# Patient Record
Sex: Male | Born: 1950 | Race: White | Hispanic: No | Marital: Married | Smoking: Never smoker
Health system: Southern US, Community
[De-identification: ages and names within clinical notes are randomized; demographics above are authoritative.]

## PROBLEM LIST (undated history)

## (undated) DIAGNOSIS — G4733 Obstructive sleep apnea (adult) (pediatric): Secondary | ICD-10-CM

## (undated) DIAGNOSIS — E7800 Pure hypercholesterolemia, unspecified: Secondary | ICD-10-CM

## (undated) DIAGNOSIS — I10 Essential (primary) hypertension: Secondary | ICD-10-CM

## (undated) DIAGNOSIS — C61 Malignant neoplasm of prostate: Secondary | ICD-10-CM

## (undated) DIAGNOSIS — E119 Type 2 diabetes mellitus without complications: Secondary | ICD-10-CM

## (undated) DIAGNOSIS — C801 Malignant (primary) neoplasm, unspecified: Secondary | ICD-10-CM

## (undated) DIAGNOSIS — M109 Gout, unspecified: Secondary | ICD-10-CM

## (undated) DIAGNOSIS — K219 Gastro-esophageal reflux disease without esophagitis: Secondary | ICD-10-CM

## (undated) DIAGNOSIS — G629 Polyneuropathy, unspecified: Secondary | ICD-10-CM

## (undated) DIAGNOSIS — E785 Hyperlipidemia, unspecified: Secondary | ICD-10-CM

## (undated) HISTORY — DX: Malignant neoplasm of prostate: C61

## (undated) HISTORY — DX: Obstructive sleep apnea (adult) (pediatric): G47.33

## (undated) HISTORY — DX: Gout, unspecified: M10.9

## (undated) HISTORY — PX: PROSTATE SURGERY: SHX751

## (undated) HISTORY — DX: Gastro-esophageal reflux disease without esophagitis: K21.9

## (undated) HISTORY — DX: Polyneuropathy, unspecified: G62.9

---

## 2000-03-06 ENCOUNTER — Ambulatory Visit (HOSPITAL_BASED_OUTPATIENT_CLINIC_OR_DEPARTMENT_OTHER): Admission: RE | Admit: 2000-03-06 | Discharge: 2000-03-06 | Payer: Self-pay | Admitting: Family Medicine

## 2000-03-09 HISTORY — PX: PROSTATE SURGERY: SHX751

## 2000-04-26 ENCOUNTER — Ambulatory Visit (HOSPITAL_BASED_OUTPATIENT_CLINIC_OR_DEPARTMENT_OTHER): Admission: RE | Admit: 2000-04-26 | Discharge: 2000-04-26 | Payer: Self-pay | Admitting: *Deleted

## 2000-06-23 ENCOUNTER — Encounter: Payer: Self-pay | Admitting: Family Medicine

## 2000-06-23 ENCOUNTER — Observation Stay (HOSPITAL_COMMUNITY): Admission: EM | Admit: 2000-06-23 | Discharge: 2000-06-24 | Payer: Self-pay | Admitting: Emergency Medicine

## 2000-11-26 ENCOUNTER — Other Ambulatory Visit: Admission: RE | Admit: 2000-11-26 | Discharge: 2000-11-26 | Payer: Self-pay | Admitting: Urology

## 2001-01-17 ENCOUNTER — Inpatient Hospital Stay (HOSPITAL_COMMUNITY): Admission: RE | Admit: 2001-01-17 | Discharge: 2001-01-20 | Payer: Self-pay | Admitting: Urology

## 2003-07-06 ENCOUNTER — Encounter: Admission: RE | Admit: 2003-07-06 | Discharge: 2003-07-06 | Payer: Self-pay | Admitting: Family Medicine

## 2006-01-06 ENCOUNTER — Encounter: Admission: RE | Admit: 2006-01-06 | Discharge: 2006-01-06 | Payer: Self-pay | Admitting: Family Medicine

## 2009-02-06 HISTORY — PX: CATARACT EXTRACTION: SUR2

## 2010-05-29 ENCOUNTER — Emergency Department (HOSPITAL_COMMUNITY)
Admission: EM | Admit: 2010-05-29 | Discharge: 2010-05-30 | Disposition: A | Discharge status: Home / Self Care | Payer: Self-pay | Attending: Emergency Medicine | Admitting: Emergency Medicine

## 2010-05-29 DIAGNOSIS — J020 Streptococcal pharyngitis: Secondary | ICD-10-CM | POA: Insufficient documentation

## 2010-05-29 DIAGNOSIS — E119 Type 2 diabetes mellitus without complications: Secondary | ICD-10-CM | POA: Insufficient documentation

## 2010-05-29 DIAGNOSIS — R070 Pain in throat: Secondary | ICD-10-CM | POA: Insufficient documentation

## 2010-05-29 DIAGNOSIS — Z79899 Other long term (current) drug therapy: Secondary | ICD-10-CM | POA: Insufficient documentation

## 2010-05-29 DIAGNOSIS — I10 Essential (primary) hypertension: Secondary | ICD-10-CM | POA: Insufficient documentation

## 2010-05-29 DIAGNOSIS — R05 Cough: Secondary | ICD-10-CM | POA: Insufficient documentation

## 2010-05-29 DIAGNOSIS — Z7982 Long term (current) use of aspirin: Secondary | ICD-10-CM | POA: Insufficient documentation

## 2010-05-29 DIAGNOSIS — R059 Cough, unspecified: Secondary | ICD-10-CM | POA: Insufficient documentation

## 2010-05-29 DIAGNOSIS — J3489 Other specified disorders of nose and nasal sinuses: Secondary | ICD-10-CM | POA: Insufficient documentation

## 2010-05-29 DIAGNOSIS — R0982 Postnasal drip: Secondary | ICD-10-CM | POA: Insufficient documentation

## 2010-05-30 LAB — RAPID STREP SCREEN (MED CTR MEBANE ONLY)

## 2013-03-13 ENCOUNTER — Emergency Department (HOSPITAL_COMMUNITY)
Admission: EM | Admit: 2013-03-13 | Discharge: 2013-03-13 | Disposition: A | Discharge status: Home / Self Care | Payer: 59 | Attending: Emergency Medicine | Admitting: Emergency Medicine

## 2013-03-13 ENCOUNTER — Encounter (HOSPITAL_COMMUNITY): Payer: Self-pay | Admitting: Emergency Medicine

## 2013-03-13 ENCOUNTER — Ambulatory Visit (HOSPITAL_COMMUNITY): Payer: 59

## 2013-03-13 DIAGNOSIS — Z8546 Personal history of malignant neoplasm of prostate: Secondary | ICD-10-CM | POA: Insufficient documentation

## 2013-03-13 DIAGNOSIS — Z9889 Other specified postprocedural states: Secondary | ICD-10-CM | POA: Insufficient documentation

## 2013-03-13 DIAGNOSIS — E7800 Pure hypercholesterolemia, unspecified: Secondary | ICD-10-CM | POA: Insufficient documentation

## 2013-03-13 DIAGNOSIS — E119 Type 2 diabetes mellitus without complications: Secondary | ICD-10-CM | POA: Insufficient documentation

## 2013-03-13 DIAGNOSIS — R109 Unspecified abdominal pain: Secondary | ICD-10-CM

## 2013-03-13 DIAGNOSIS — Z7982 Long term (current) use of aspirin: Secondary | ICD-10-CM | POA: Insufficient documentation

## 2013-03-13 DIAGNOSIS — I10 Essential (primary) hypertension: Secondary | ICD-10-CM | POA: Insufficient documentation

## 2013-03-13 DIAGNOSIS — R229 Localized swelling, mass and lump, unspecified: Secondary | ICD-10-CM

## 2013-03-13 DIAGNOSIS — R1031 Right lower quadrant pain: Secondary | ICD-10-CM | POA: Insufficient documentation

## 2013-03-13 DIAGNOSIS — R1012 Left upper quadrant pain: Secondary | ICD-10-CM | POA: Insufficient documentation

## 2013-03-13 HISTORY — DX: Pure hypercholesterolemia, unspecified: E78.00

## 2013-03-13 HISTORY — DX: Type 2 diabetes mellitus without complications: E11.9

## 2013-03-13 HISTORY — DX: Essential (primary) hypertension: I10

## 2013-03-13 HISTORY — DX: Malignant (primary) neoplasm, unspecified: C80.1

## 2013-03-13 LAB — COMPREHENSIVE METABOLIC PANEL
ALBUMIN: 4.6 g/dL (ref 3.5–5.2)
ALK PHOS: 76 U/L (ref 39–117)
ALT: 29 U/L (ref 0–53)
AST: 20 U/L (ref 0–37)
BUN: 16 mg/dL (ref 6–23)
CO2: 25 mEq/L (ref 19–32)
Calcium: 9.9 mg/dL (ref 8.4–10.5)
Chloride: 93 mEq/L — ABNORMAL LOW (ref 96–112)
Creatinine, Ser: 0.92 mg/dL (ref 0.50–1.35)
GFR calc Af Amer: >90 mL/min (ref >90)
GFR calc non Af Amer: 90 mL/min — ABNORMAL LOW (ref >90)
Glucose, Bld: 121 mg/dL — ABNORMAL HIGH (ref 70–99)
POTASSIUM: 4.5 meq/L (ref 3.7–5.3)
Sodium: 133 mEq/L — ABNORMAL LOW (ref 137–147)
TOTAL PROTEIN: 8.3 g/dL (ref 6.0–8.3)
Total Bilirubin: 0.6 mg/dL (ref 0.3–1.2)

## 2013-03-13 LAB — CBC WITH DIFFERENTIAL/PLATELET
BASOS ABS: 0 10*3/uL (ref 0.0–0.1)
BASOS PCT: 0 % (ref 0–1)
Eosinophils Absolute: 0.1 10*3/uL (ref 0.0–0.7)
Eosinophils Relative: 1 % (ref 0–5)
HCT: 45.6 % (ref 39.0–52.0)
Hemoglobin: 15.6 g/dL (ref 13.0–17.0)
Lymphocytes Relative: 18 % (ref 12–46)
Lymphs Abs: 1.9 10*3/uL (ref 0.7–4.0)
MCH: 27.6 pg (ref 26.0–34.0)
MCHC: 34.2 g/dL (ref 30.0–36.0)
MCV: 80.6 fL (ref 78.0–100.0)
Monocytes Absolute: 0.8 10*3/uL (ref 0.1–1.0)
Monocytes Relative: 7 % (ref 3–12)
NEUTROS ABS: 7.7 10*3/uL (ref 1.7–7.7)
NEUTROS PCT: 74 % (ref 43–77)
PLATELETS: 173 10*3/uL (ref 150–400)
RBC: 5.66 MIL/uL (ref 4.22–5.81)
RDW: 14.3 % (ref 11.5–15.5)
WBC: 10.5 10*3/uL (ref 4.0–10.5)

## 2013-03-13 LAB — URINALYSIS, ROUTINE W REFLEX MICROSCOPIC
Glucose, UA: NEGATIVE mg/dL
Hgb urine dipstick: NEGATIVE
Ketones, ur: NEGATIVE mg/dL
LEUKOCYTES UA: NEGATIVE
NITRITE: NEGATIVE
Protein, ur: 30 mg/dL — AB
SPECIFIC GRAVITY, URINE: 1.028 (ref 1.005–1.030)
Urobilinogen, UA: 1 mg/dL (ref 0.0–1.0)
pH: 5.5 (ref 5.0–8.0)

## 2013-03-13 LAB — URINE MICROSCOPIC-ADD ON

## 2013-03-13 LAB — LIPASE, BLOOD: Lipase: 17 U/L (ref 11–59)

## 2013-03-13 MED ORDER — PANTOPRAZOLE SODIUM 40 MG PO TBEC: 40.0000 mg | DELAYED_RELEASE_TABLET | Freq: Every day | ORAL | Status: DC

## 2013-03-13 MED ORDER — GI COCKTAIL ~~LOC~~
30.0000 mL | Freq: Once | ORAL | Status: AC
  Administered 2013-03-13: 30 mL via ORAL
  Filled 2013-03-13: qty 30

## 2013-03-13 MED ORDER — IOHEXOL 300 MG/ML  SOLN
100.0000 mL | Freq: Once | INTRAMUSCULAR | Status: AC | PRN
  Administered 2013-03-13: 100 mL via INTRAVENOUS

## 2013-03-13 MED ORDER — IOHEXOL 300 MG/ML  SOLN
50.0000 mL | Freq: Once | INTRAMUSCULAR | Status: AC | PRN
  Administered 2013-03-13: 50 mL via ORAL

## 2013-03-13 MED ORDER — SODIUM CHLORIDE 0.9 % IV BOLUS (SEPSIS)
1000.0000 mL | Freq: Once | INTRAVENOUS | Status: AC
  Administered 2013-03-13: 1000 mL via INTRAVENOUS

## 2013-03-13 NOTE — ED Provider Notes (Signed)
CSN: [id number]     Arrival date & time 03/13/13  1217 History   First MD Initiated Contact with Patient 03/13/13 1308     Chief Complaint  Patient presents with  . Abdominal Pain   (Consider location/radiation/quality/duration/timing/severity/associated sxs/prior Treatment) HPI Comments: 63 year old male presents with 3 days of upper abdominal pain he states he feels more like "discomfort" that specific pain. This is been intermittent but today it has been constant. He describes is about 5 or 6/10. No nausea or vomiting. He states that he ate a light meal this morning but felt like he just sat in his stomach. He has not had any diarrhea or vomiting. He states that he had a small bowel movement yesterday but was not hard. He's never had abdominal pain like this before he has had surgery for prostate cancer in the past but no other surgeries. No chest pain or shortness of breath. Declines wanting any pain medicines at this time.   Past Medical History  Diagnosis Date  . Cancer   . Hypertension   . Diabetes mellitus without complication   . High blood cholesterol    Past Surgical History  Procedure Laterality Date  . Prostate surgery     No family history on file. History  Substance Use Topics  . Smoking status: Never Smoker   . Smokeless tobacco: Not on file  . Alcohol Use: No    Review of Systems  Constitutional: Negative for fever and chills.  Respiratory: Negative for shortness of breath.   Cardiovascular: Negative for chest pain.  Gastrointestinal: Positive for abdominal pain. Negative for nausea, vomiting, diarrhea, constipation and blood in stool.  Genitourinary: Negative for dysuria and decreased urine volume.  Neurological: Negative for weakness.  All other systems reviewed and are negative.    Allergies  Review of patient's allergies indicates no known allergies.  Home Medications   Current Outpatient Rx  Name  Route  Sig  Dispense  Refill  . allopurinol  (ZYLOPRIM) 300 MG tablet   Oral   Take 300 mg by mouth at bedtime.         Marland Kitchen aspirin 325 MG tablet   Oral   Take 325 mg by mouth at bedtime.         . benazepril-hydrochlorthiazide (LOTENSIN HCT) 20-12.5 MG per tablet   Oral   Take 1 tablet by mouth at bedtime.         . Cimetidine (ACID REDUCER PO)   Oral   Take 1 tablet by mouth 2 (two) times daily as needed (heartburn).         . metFORMIN (GLUCOPHAGE-XR) 500 MG 24 hr tablet   Oral   Take 1,000 mg by mouth at bedtime.         . simvastatin (ZOCOR) 20 MG tablet   Oral   Take 20 mg by mouth at bedtime.          BP 118/81  Pulse 91  Temp(Src) 97.8 F (36.6 C) (Oral)  Resp 14  SpO2 97% Physical Exam  Nursing note and vitals reviewed. Constitutional: He is oriented to person, place, and time. He appears well-developed and well-nourished. No distress.  HENT:  Head: Normocephalic and atraumatic.  Right Ear: External ear normal.  Left Ear: External ear normal.  Nose: Nose normal.  Eyes: Right eye exhibits no discharge. Left eye exhibits no discharge.  Neck: Neck supple.  Cardiovascular: Normal rate, regular rhythm, normal heart sounds and intact distal pulses.   Pulmonary/Chest: Effort  normal.  Abdominal: Soft. There is tenderness in the right lower quadrant and left upper quadrant.  Musculoskeletal: He exhibits no edema.  Neurological: He is alert and oriented to person, place, and time.  Skin: Skin is warm and dry.    ED Course  Procedures (including critical care time) Labs Review Labs Reviewed  COMPREHENSIVE METABOLIC PANEL - Abnormal; Notable for the following:    Sodium 133 (*)    Chloride 93 (*)    Glucose, Bld 121 (*)    GFR calc non Af Amer 90 (*)    All other components within normal limits  URINALYSIS, ROUTINE W REFLEX MICROSCOPIC - Abnormal; Notable for the following:    Color, Urine AMBER (*)    Bilirubin Urine SMALL (*)    Protein, ur 30 (*)    All other components within normal  limits  URINE MICROSCOPIC-ADD ON - Abnormal; Notable for the following:    Squamous Epithelial / LPF FEW (*)    All other components within normal limits  CBC WITH DIFFERENTIAL  LIPASE, BLOOD   Imaging Review Ct Abdomen Pelvis W Contrast  03/13/2013   CLINICAL DATA:  History of prostate cancer and surgery. Mid abdominal pain.  EXAM: CT ABDOMEN AND PELVIS WITH CONTRAST  TECHNIQUE: Multidetector CT imaging of the abdomen and pelvis was performed using the standard protocol following bolus administration of intravenous contrast.  CONTRAST:  130mL OMNIPAQUE IOHEXOL 300 MG/ML  SOLN  COMPARISON:  None.  FINDINGS: 5 mm nodule in the right middle lobe on sequence 6, image 8. 3 mm nodule in the lingula on image 4. There is no evidence for free air.  Normal appearance of the liver, gallbladder, portal venous system, pancreas, spleen, adrenal glands and both kidneys. No evidence for hydronephrosis. No gross abnormality to the urinary bladder. Normal appearance of the appendix. No gross abnormality to the small or large bowel. Small lymph nodes in the mesentery are within normal limits. Overall, there is no significant free fluid or lymphadenopathy. There is inflamed soft tissue or fat in the left groin. This abnormal tissue appears to be just medial to the left inguinal canal and extends down towards the scrotum. There is no bowel involved within this inflamed soft tissue.  Degenerative facet changes in the lower lumbar spine.  IMPRESSION: Inflamed soft tissue in the left groin that extends down towards the scrotum. This tissue appears to represent herniating tissue but the structure is medial to the left inguinal canal and there is no bowel involvement. An incarcerated hernia cannot be excluded.  Small pulmonary nodules and the largest measures 5 mm. If the patient is at high risk for bronchogenic carcinoma, follow-up chest CT at 6-12 months is recommended. If the patient is at low risk for bronchogenic carcinoma,  follow-up chest CT at 12 months is recommended. This recommendation follows the consensus statement: Guidelines for Management of Small Pulmonary Nodules Detected on CT Scans: A Statement from the [place] as published in Radiology 2005;237:395-400.  These results were called by telephone at the time of interpretation on 03/13/2013 at 3:12 PM to Dr. Sherwood Gambler , who verbally acknowledged these results.   Electronically Signed   By: Markus Daft M.D.   On: 03/13/2013 15:15    EKG Interpretation   None       MDM   1. Abdominal pain    Patient is well appearing, has soft exam with mild tenderness in RLQ and LUQ. CT benign for acute pathology except for groin findings  as above. No hernia appreciated on re-exam. Patient denies any groin pain on exam. Discussed with surgery, Dr. Marlou Starks, who evaluated in ER, and he feels the small mass representing this site but does not feel this is troublesome now as he has no tenderness or pain there. Will f/u in clinic in 2-3 weeks, return if symptoms develop there. LUQ pain is likely gastritis/GERD, will treat with PPI and f/u with PCP.    Ephraim Hamburger, MD 03/13/13 [phone]

## 2013-03-13 NOTE — Consult Note (Signed)
Reason for Consult:Left groin inflammation Referring Physician: Dr. Sherwood Gambler PCP:  Dr. Gaynelle Arabian  Glenn Herrera is an 63 y.o. male.  HPI: 63 y/o male who reports discomfort under his ribs starting last Friday 03/10/13.  No nausea, vomiting, diarrhea, constipation just what sounds like GERD symptoms, with worsening since onset.  He has been able to eat, and has had bowel movements, with this.  He does note he usually has one daily and has been qod for the last couple days.  Had 2 chicken sandwiches yesterday with no symptoms. He was seen in the ER here today and CT scan shows:  Inflamed soft tissue in the left groin that extends down towards the scrotum. This tissue appears to represent herniating tissue but the structure is medial to the left inguinal canal and there is no bowel involvement. An incarcerated hernia cannot be excluded.  On exam of the area you can feel a rounded mass in the left groin, but it is not tender, no pain with palpation and he was not really aware of it.  It is not related to his complaints of mid epigastric pain and discomfort.  Labs are essentially normal.  Because of the CT findings we were ask to see and evaluate.      Past Medical History  Diagnosis Date  . Cancer   . Hypertension   . Diabetes mellitus without complication   . High blood cholesterol     Past Surgical History  Procedure Laterality Date  . Prostate surgery/12 years ago, no further disease.      No family history on file.  Social History:  reports that he has never smoked. He does not have any smokeless tobacco history on file. He reports that he does not drink alcohol. His drug history is not on file.  Allergies: No Known Allergies  Medications:  Prior to Admission medications   Medication Sig Start Date End Date Taking? Authorizing Provider  allopurinol (ZYLOPRIM) 300 MG tablet Take 300 mg by mouth at bedtime.   Yes Historical Provider, MD  aspirin 325 MG tablet Take 325 mg by  mouth at bedtime.   Yes Historical Provider, MD  benazepril-hydrochlorthiazide (LOTENSIN HCT) 20-12.5 MG per tablet Take 1 tablet by mouth at bedtime.   Yes Historical Provider, MD  Cimetidine (ACID REDUCER PO) Take 1 tablet by mouth 2 (two) times daily as needed (heartburn).   Yes Historical Provider, MD  metFORMIN (GLUCOPHAGE-XR) 500 MG 24 hr tablet Take 1,000 mg by mouth at bedtime.   Yes Historical Provider, MD  simvastatin (ZOCOR) 20 MG tablet Take 20 mg by mouth at bedtime.   Yes Historical Provider, MD  pantoprazole (PROTONIX) 40 MG tablet Take 1 tablet (40 mg total) by mouth daily. 03/13/13   Ephraim Hamburger, MD     Anti-infectives   None      Results for orders placed during the hospital encounter of 03/13/13 (from the past 48 hour(s))  CBC WITH DIFFERENTIAL     Status: None   Collection Time    03/13/13 12:50 PM      Result Value Range   WBC 10.5  4.0 - 10.5 K/uL   RBC 5.66  4.22 - 5.81 MIL/uL   Hemoglobin 15.6  13.0 - 17.0 g/dL   HCT 45.6  39.0 - 52.0 %   MCV 80.6  78.0 - 100.0 fL   MCH 27.6  26.0 - 34.0 pg   MCHC 34.2  30.0 - 36.0 g/dL  RDW 14.3  11.5 - 15.5 %   Platelets 173  150 - 400 K/uL   Neutrophils Relative % 74  43 - 77 %   Neutro Abs 7.7  1.7 - 7.7 K/uL   Lymphocytes Relative 18  12 - 46 %   Lymphs Abs 1.9  0.7 - 4.0 K/uL   Monocytes Relative 7  3 - 12 %   Monocytes Absolute 0.8  0.1 - 1.0 K/uL   Eosinophils Relative 1  0 - 5 %   Eosinophils Absolute 0.1  0.0 - 0.7 K/uL   Basophils Relative 0  0 - 1 %   Basophils Absolute 0.0  0.0 - 0.1 K/uL  COMPREHENSIVE METABOLIC PANEL     Status: Abnormal   Collection Time    03/13/13 12:50 PM      Result Value Range   Sodium 133 (*) 137 - 147 mEq/L   Potassium 4.5  3.7 - 5.3 mEq/L   Chloride 93 (*) 96 - 112 mEq/L   CO2 25  19 - 32 mEq/L   Glucose, Bld 121 (*) 70 - 99 mg/dL   BUN 16  6 - 23 mg/dL   Creatinine, Ser 0.92  0.50 - 1.35 mg/dL   Calcium 9.9  8.4 - 10.5 mg/dL   Total Protein 8.3  6.0 - 8.3 g/dL    Albumin 4.6  3.5 - 5.2 g/dL   AST 20  0 - 37 U/L   ALT 29  0 - 53 U/L   Alkaline Phosphatase 76  39 - 117 U/L   Total Bilirubin 0.6  0.3 - 1.2 mg/dL   GFR calc non Af Amer 90 (*) >90 mL/min   GFR calc Af Amer >90  >90 mL/min   Comment: (NOTE)     The eGFR has been calculated using the CKD EPI equation.     This calculation has not been validated in all clinical situations.     eGFR's persistently <90 mL/min signify possible Chronic Kidney     Disease.  LIPASE, BLOOD     Status: None   Collection Time    03/13/13 12:50 PM      Result Value Range   Lipase 17  11 - 59 U/L  URINALYSIS, ROUTINE W REFLEX MICROSCOPIC     Status: Abnormal   Collection Time    03/13/13  1:34 PM      Result Value Range   Color, Urine AMBER (*) YELLOW   Comment: BIOCHEMICALS MAY BE AFFECTED BY COLOR   APPearance CLEAR  CLEAR   Specific Gravity, Urine 1.028  1.005 - 1.030   pH 5.5  5.0 - 8.0   Glucose, UA NEGATIVE  NEGATIVE mg/dL   Hgb urine dipstick NEGATIVE  NEGATIVE   Bilirubin Urine SMALL (*) NEGATIVE   Ketones, ur NEGATIVE  NEGATIVE mg/dL   Protein, ur 30 (*) NEGATIVE mg/dL   Urobilinogen, UA 1.0  0.0 - 1.0 mg/dL   Nitrite NEGATIVE  NEGATIVE   Leukocytes, UA NEGATIVE  NEGATIVE  URINE MICROSCOPIC-ADD ON     Status: Abnormal   Collection Time    03/13/13  1:34 PM      Result Value Range   Squamous Epithelial / LPF FEW (*) RARE   WBC, UA 0-2  <3 WBC/hpf   Urine-Other MUCOUS PRESENT      Ct Abdomen Pelvis W Contrast  03/13/2013   CLINICAL DATA:  History of prostate cancer and surgery. Mid abdominal pain.  EXAM: CT ABDOMEN AND PELVIS WITH  CONTRAST  TECHNIQUE: Multidetector CT imaging of the abdomen and pelvis was performed using the standard protocol following bolus administration of intravenous contrast.  CONTRAST:  19m OMNIPAQUE IOHEXOL 300 MG/ML  SOLN  COMPARISON:  None.  FINDINGS: 5 mm nodule in the right middle lobe on sequence 6, image 8. 3 mm nodule in the lingula on image 4. There is no  evidence for free air.  Normal appearance of the liver, gallbladder, portal venous system, pancreas, spleen, adrenal glands and both kidneys. No evidence for hydronephrosis. No gross abnormality to the urinary bladder. Normal appearance of the appendix. No gross abnormality to the small or large bowel. Small lymph nodes in the mesentery are within normal limits. Overall, there is no significant free fluid or lymphadenopathy. There is inflamed soft tissue or fat in the left groin. This abnormal tissue appears to be just medial to the left inguinal canal and extends down towards the scrotum. There is no bowel involved within this inflamed soft tissue.  Degenerative facet changes in the lower lumbar spine.  IMPRESSION: Inflamed soft tissue in the left groin that extends down towards the scrotum. This tissue appears to represent herniating tissue but the structure is medial to the left inguinal canal and there is no bowel involvement. An incarcerated hernia cannot be excluded.  Small pulmonary nodules and the largest measures 5 mm. If the patient is at high risk for bronchogenic carcinoma, follow-up chest CT at 6-12 months is recommended. If the patient is at low risk for bronchogenic carcinoma, follow-up chest CT at 12 months is recommended. This recommendation follows the consensus statement: Guidelines for Management of Small Pulmonary Nodules Detected on CT Scans: A Statement from the [employer] published in Radiology 2005;237:395-400.  These results were called by telephone at the time of interpretation on 03/13/2013 at 3:12 PM to Dr. SSherwood Gambler, who verbally acknowledged these results.   Electronically Signed   By: AMarkus DaftM.D.   On: 03/13/2013 15:15    Review of Systems  Constitutional: Negative.   HENT: Negative.   Eyes: Negative.   Respiratory: Negative.   Cardiovascular: Negative.   Gastrointestinal: Positive for heartburn and constipation (stools qod instead of daily as usual).  Negative for nausea, vomiting, abdominal pain and diarrhea.  Genitourinary: Negative.   Musculoskeletal: Negative.   Skin: Negative.   Neurological: Negative.   Endo/Heme/Allergies: Negative.   Psychiatric/Behavioral: Negative.    Blood pressure 118/81, pulse 91, temperature 97.8 F (36.6 C), temperature source Oral, resp. rate 14, SpO2 97.00%. Physical Exam  Constitutional: He is oriented to person, place, and time. He appears well-developed and well-nourished.  BMI about 38, he's not sure of weight and height now.  HENT:  Head: Normocephalic and atraumatic.  Nose: Nose normal.  Eyes: Conjunctivae and EOM are normal. Pupils are equal, round, and reactive to light. Right eye exhibits discharge. Left eye exhibits no discharge.  Neck: Normal range of motion. Neck supple. No JVD present. No tracheal deviation present. No thyromegaly present.  Cardiovascular: Normal rate, regular rhythm, normal heart sounds and intact distal pulses.  Exam reveals no gallop.   No murmur heard. Respiratory: Breath sounds normal. No respiratory distress. He has no wheezes. He has no rales. He exhibits no tenderness.  GI: He exhibits mass (you can feel a raised area about 2-3 cm in diameter left groin.  it is not tender and is in no way related to his pain.). He exhibits no distension. There is no tenderness. There is no  rebound and no guarding.  Hid discomfort is mid epigastric and under his ribs.  No tenderness or discomfort with palpation.  Musculoskeletal: He exhibits no edema and no tenderness.  Neurological: He is alert and oriented to person, place, and time. No cranial nerve deficit.  Skin: Skin is warm and dry. No rash noted. No erythema. No pallor.  Psychiatric: He has a normal mood and affect. His behavior is normal. Judgment and thought content normal.    Assessment/Plan: 1.  Mid epigastric and upper abdominal pain. 2. Left groin soft tissue inflammation, asymptomatic;   Possibly related to old  prostatectomy surgery. 3.  Hypertension 4.  AODM 5.  Dyslipidemia 6.  Hx of prostatectomy with no reoccurrence of the the  Cancer. 7.  BMI 38 8.  Gout  Plan:  Dr. Marlou Starks has seen the patient and reviewed the CT scan.  He does have some inflammation in this area but it is unrelated to his issue.  There does not appear to be any bowel involvement, and no symptoms related to any type of obstruction.  He can follow up as an outpatient in 2-4 weeks.  Sooner if he has an issue.  Thank you for asking [place] to see the patient.   Akaylah Lalley 03/13/2013, 4:22 PM

## 2013-03-13 NOTE — ED Notes (Signed)
Pt states that since Friday he feels like everything in his abd is pushed up toward chest. Pt states that he does have intermittent pain and discomfort. Pt states that he had only small balls for BM last night and Friday wasn't able to have "normal, good BM".

## 2013-03-13 NOTE — Discharge Instructions (Signed)

## 2013-03-14 NOTE — Consult Note (Signed)
The spot seen on CT is completely asymptomatic. May follow up with [place] for this in next couple weeks

## 2013-04-06 ENCOUNTER — Ambulatory Visit (INDEPENDENT_AMBULATORY_CARE_PROVIDER_SITE_OTHER): Payer: PRIVATE HEALTH INSURANCE | Admitting: General Surgery

## 2013-04-06 ENCOUNTER — Encounter (INDEPENDENT_AMBULATORY_CARE_PROVIDER_SITE_OTHER): Payer: Self-pay | Admitting: General Surgery

## 2013-04-06 VITALS — BP 132/84 | HR 78 | Temp 98.3°F | Resp 16 | Ht 69.5 in | Wt 266.2 lb

## 2013-04-06 DIAGNOSIS — K4090 Unilateral inguinal hernia, without obstruction or gangrene, not specified as recurrent: Secondary | ICD-10-CM

## 2013-04-06 NOTE — Patient Instructions (Signed)
Please call our office if you change your mind about surgery  Hernia A hernia occurs when an internal organ pushes out through a weak spot in the abdominal wall. Hernias most commonly occur in the groin and around the navel. Hernias often can be pushed back into place (reduced). Most hernias tend to get worse over time. Some abdominal hernias can get stuck in the opening (irreducible or incarcerated hernia) and cannot be reduced. An irreducible abdominal hernia which is tightly squeezed into the opening is at risk for impaired blood supply (strangulated hernia). A strangulated hernia is a medical emergency. Because of the risk for an irreducible or strangulated hernia, surgery may be recommended to repair a hernia. CAUSES   Heavy lifting.  Prolonged coughing.  Straining to have a bowel movement.  A cut (incision) made during an abdominal surgery. HOME CARE INSTRUCTIONS   Bed rest is not required. You may continue your normal activities.  Cough gently. If you are a smoker it is best to stop. Even the best hernia repair can break down with the continual strain of coughing. Even if you do not have your hernia repaired, a cough will continue to aggravate the problem.  Do not wear anything tight over your hernia. Do not try to keep it in with an outside bandage or truss. These can damage abdominal contents if they are trapped within the hernia sac.  Eat a normal diet.  Avoid constipation. Straining over long periods of time will increase hernia size and encourage breakdown of repairs. If you cannot do this with diet alone, stool softeners may be used. SEEK IMMEDIATE MEDICAL CARE IF:   You have a fever.  You develop increasing abdominal pain.  You feel nauseous or vomit.  Your hernia is stuck outside the abdomen, looks discolored, feels hard, or is tender.  You have any changes in your bowel habits or in the hernia that are unusual for you.  You have increased pain or swelling around  the hernia.  You cannot push the hernia back in place by applying gentle pressure while lying down. MAKE SURE YOU:   Understand these instructions.  Will watch your condition.  Will get help right away if you are not doing well or get worse. Document Released: 02/23/2005 Document Revised: 05/18/2011 Document Reviewed: 10/13/2007 [facility] Patient Information 2014 [place].

## 2013-04-06 NOTE — Progress Notes (Signed)
Patient ID: Glenn Herrera, male   DOB: 11-Nov-1950, 63 y.o.   MRN: [id number]  Chief Complaint  Patient presents with  . Hernia    HPI Glenn Herrera is a 63 y.o. male.   HPI 63 year old Caucasian male comes in for followup after being seen as a consult in the emergency department by our physician extender and one of our surgeons. He initially went to the emergency room earlier in the month for upper abdominal pain. He underwent labs and a CT scan which was essentially unremarkable except for an area of abnormality in his left groin. It was felt that he may have an inguinal hernia. He states that the upper abdominal pain that he had on presentation to the emergency room has resolved. He states the pain resolved after starting to take an acid reducer. He denies any inguinal discomfort. He denies any inguinal bulge. He denies any burning, stinging, or shooting pain in his groin. He denies any dull ache. He's had a prior open prostate surgery 12 years for prostate cancer. He has a bowel movement every other day. He has to wear a pad. He denies noticing any lump or bulge in his groin Past Medical History  Diagnosis Date  . Cancer   . Hypertension   . Diabetes mellitus without complication   . High blood cholesterol     Past Surgical History  Procedure Laterality Date  . Prostate surgery      History reviewed. No pertinent family history.  Social History History  Substance Use Topics  . Smoking status: Never Smoker   . Smokeless tobacco: Not on file  . Alcohol Use: No    No Known Allergies  Current Outpatient Prescriptions  Medication Sig Dispense Refill  . allopurinol (ZYLOPRIM) 300 MG tablet Take 300 mg by mouth at bedtime.      Marland Kitchen aspirin 325 MG tablet Take 325 mg by mouth at bedtime.      . benazepril-hydrochlorthiazide (LOTENSIN HCT) 20-12.5 MG per tablet Take 1 tablet by mouth at bedtime.      . Cimetidine (ACID REDUCER PO) Take 1 tablet by mouth 2 (two) times daily as needed  (heartburn).      . metFORMIN (GLUCOPHAGE-XR) 500 MG 24 hr tablet Take 1,000 mg by mouth at bedtime.      . pantoprazole (PROTONIX) 40 MG tablet Take 1 tablet (40 mg total) by mouth daily.  14 tablet  0  . simvastatin (ZOCOR) 20 MG tablet Take 20 mg by mouth at bedtime.       No current facility-administered medications for this visit.    Review of Systems Review of Systems  Constitutional: Negative for fever, chills, appetite change and unexpected weight change.  HENT: Negative for congestion and trouble swallowing.   Eyes: Negative for visual disturbance.  Respiratory: Negative for chest tightness and shortness of breath.   Cardiovascular: Negative for chest pain and leg swelling.       No PND, no orthopnea, no DOE  Gastrointestinal:       See HPI  Genitourinary: Negative for dysuria and hematuria.       +nocturia. +wear pad.   Musculoskeletal: Negative.   Skin: Negative for rash.  Neurological: Negative for seizures and speech difficulty.  Hematological: Does not bruise/bleed easily.  Psychiatric/Behavioral: Negative for behavioral problems and confusion.    Blood pressure 132/84, pulse 78, temperature 98.3 F (36.8 C), temperature source Temporal, resp. rate 16, height 5' 9.5" (1.765 m), weight 266 lb 3.2 oz (  120.748 kg).  Physical Exam Physical Exam  Vitals reviewed. Constitutional: He is oriented to person, place, and time. He appears well-developed and well-nourished. No distress.  Morbidly obese  HENT:  Head: Normocephalic and atraumatic.  Right Ear: External ear normal.  Left Ear: External ear normal.  Eyes: Conjunctivae are normal. No scleral icterus.  Neck: Normal range of motion. Neck supple. No tracheal deviation present. No thyromegaly present.  Cardiovascular: Normal rate, normal heart sounds and intact distal pulses.   Pulmonary/Chest: Effort normal and breath sounds normal. No respiratory distress. He has no wheezes.  Abdominal: Soft. He exhibits no  distension. There is no tenderness. There is no rebound. A hernia is present. Hernia confirmed positive in the left inguinal area. Hernia confirmed negative in the right inguinal area.    Genitourinary: Testes normal.     Large amount of suprapubic fat. Easier to appreciate hernia while supine. Urine odor in pants  Musculoskeletal: Normal range of motion. He exhibits no edema and no tenderness.  Lymphadenopathy:    He has no cervical adenopathy.  Neurological: He is alert and oriented to person, place, and time. He exhibits normal muscle tone.  Skin: Skin is warm and dry. No rash noted. He is not diaphoretic. No erythema. No pallor.  Psychiatric: He has a normal mood and affect. His behavior is normal. Judgment and thought content normal.    Data Reviewed Will Glenn Hines, PA-C and Dr Marlou Starks consult note CT a/p pelvis  Assessment    Left inguinal hernia     Plan    We discussed the etiology of inguinal hernias. We discussed the signs & symptoms of incarceration & strangulation.  We discussed non-operative and operative management.    I described an open hernia repair in detail.  The patient was given educational material. We discussed the risks and benefits including but not limited to bleeding, infection, chronic inguinal pain, nerve entrapment, hernia recurrence, mesh complications, hematoma formation, urinary retention, injury to the testicle, numbness in the groin, blood clots, injury to the surrounding structures, and anesthesia risk. We also discussed the typical post operative recovery course, including no heavy lifting for 4-6 weeks. I explained that the likelihood of improvement of their symptoms is good  The patient has elected to continue to observe the area for now. F/u prn  Leighton Ruff. Redmond Pulling, MD, FACS General, Bariatric, & Minimally Invasive Surgery [facility], [place]            [facility] M 04/06/2013, 4:54 PM

## 2016-01-08 HISTORY — PX: LAPAROSCOPIC APPENDECTOMY: SUR753

## 2016-02-05 ENCOUNTER — Encounter (HOSPITAL_COMMUNITY): Payer: Self-pay | Admitting: Emergency Medicine

## 2016-02-05 ENCOUNTER — Emergency Department (HOSPITAL_COMMUNITY): Payer: 59

## 2016-02-05 ENCOUNTER — Observation Stay (HOSPITAL_COMMUNITY)
Admission: EM | Admit: 2016-02-05 | Discharge: 2016-02-07 | Disposition: A | Discharge status: Home / Self Care | Payer: 59 | Attending: General Surgery | Admitting: General Surgery

## 2016-02-05 DIAGNOSIS — E119 Type 2 diabetes mellitus without complications: Secondary | ICD-10-CM | POA: Diagnosis not present

## 2016-02-05 DIAGNOSIS — K3580 Unspecified acute appendicitis: Secondary | ICD-10-CM

## 2016-02-05 DIAGNOSIS — K37 Unspecified appendicitis: Secondary | ICD-10-CM | POA: Diagnosis present

## 2016-02-05 DIAGNOSIS — K353 Acute appendicitis with localized peritonitis: Principal | ICD-10-CM | POA: Insufficient documentation

## 2016-02-05 DIAGNOSIS — E785 Hyperlipidemia, unspecified: Secondary | ICD-10-CM | POA: Insufficient documentation

## 2016-02-05 DIAGNOSIS — Z7982 Long term (current) use of aspirin: Secondary | ICD-10-CM | POA: Insufficient documentation

## 2016-02-05 DIAGNOSIS — Z8546 Personal history of malignant neoplasm of prostate: Secondary | ICD-10-CM | POA: Insufficient documentation

## 2016-02-05 DIAGNOSIS — I10 Essential (primary) hypertension: Secondary | ICD-10-CM | POA: Insufficient documentation

## 2016-02-05 LAB — URINALYSIS, ROUTINE W REFLEX MICROSCOPIC
Bilirubin Urine: NEGATIVE
GLUCOSE, UA: 100 mg/dL — AB
Hgb urine dipstick: NEGATIVE
Ketones, ur: NEGATIVE mg/dL
LEUKOCYTES UA: NEGATIVE
Nitrite: NEGATIVE
PROTEIN: 30 mg/dL — AB
Specific Gravity, Urine: 1.025 (ref 1.005–1.030)
pH: 5.5 (ref 5.0–8.0)

## 2016-02-05 LAB — COMPREHENSIVE METABOLIC PANEL
ALT: 31 U/L (ref 17–63)
AST: 28 U/L (ref 15–41)
Albumin: 4.6 g/dL (ref 3.5–5.0)
Alkaline Phosphatase: 63 U/L (ref 38–126)
Anion gap: 8 (ref 5–15)
BUN: 17 mg/dL (ref 6–20)
CHLORIDE: 101 mmol/L (ref 101–111)
CO2: 27 mmol/L (ref 22–32)
CREATININE: 0.95 mg/dL (ref 0.61–1.24)
Calcium: 9.3 mg/dL (ref 8.9–10.3)
Glucose, Bld: 217 mg/dL — ABNORMAL HIGH (ref 65–99)
POTASSIUM: 4.4 mmol/L (ref 3.5–5.1)
Sodium: 136 mmol/L (ref 135–145)
Total Bilirubin: 0.6 mg/dL (ref 0.3–1.2)
Total Protein: 7.9 g/dL (ref 6.5–8.1)

## 2016-02-05 LAB — CBC
HEMATOCRIT: 38.7 % — AB (ref 39.0–52.0)
Hemoglobin: 13.2 g/dL (ref 13.0–17.0)
MCH: 27.6 pg (ref 26.0–34.0)
MCHC: 34.1 g/dL (ref 30.0–36.0)
MCV: 81 fL (ref 78.0–100.0)
PLATELETS: 153 10*3/uL (ref 150–400)
RBC: 4.78 MIL/uL (ref 4.22–5.81)
RDW: 14.3 % (ref 11.5–15.5)
WBC: 11.8 10*3/uL — AB (ref 4.0–10.5)

## 2016-02-05 LAB — URINE MICROSCOPIC-ADD ON

## 2016-02-05 LAB — LIPASE, BLOOD: LIPASE: 19 U/L (ref 11–51)

## 2016-02-05 MED ORDER — IOPAMIDOL (ISOVUE-300) INJECTION 61%
100.0000 mL | Freq: Once | INTRAVENOUS | Status: AC | PRN
  Administered 2016-02-05: 100 mL via INTRAVENOUS

## 2016-02-05 MED ORDER — SODIUM CHLORIDE 0.9 % IV SOLN
1000.0000 mL | INTRAVENOUS | Status: DC
  Administered 2016-02-05 – 2016-02-07 (×3): 1000 mL via INTRAVENOUS

## 2016-02-05 MED ORDER — IOPAMIDOL (ISOVUE-300) INJECTION 61%
INTRAVENOUS | Status: AC
  Filled 2016-02-05: qty 100

## 2016-02-05 MED ORDER — ONDANSETRON HCL 4 MG/2ML IJ SOLN: 4.0000 mg | Freq: Three times a day (TID) | INTRAMUSCULAR | Status: DC | PRN

## 2016-02-05 MED ORDER — HYDROMORPHONE HCL 1 MG/ML IJ SOLN: 0.5000 mg | INTRAMUSCULAR | Status: AC | PRN

## 2016-02-05 MED ORDER — SODIUM CHLORIDE 0.9 % IV SOLN
1000.0000 mL | Freq: Once | INTRAVENOUS | Status: AC
  Administered 2016-02-05: 1000 mL via INTRAVENOUS

## 2016-02-05 MED ORDER — ONDANSETRON HCL 4 MG/2ML IJ SOLN
4.0000 mg | Freq: Four times a day (QID) | INTRAMUSCULAR | Status: DC | PRN
  Administered 2016-02-05: 4 mg via INTRAVENOUS
  Filled 2016-02-05: qty 2

## 2016-02-05 MED ORDER — MORPHINE SULFATE (PF) 4 MG/ML IV SOLN
6.0000 mg | Freq: Once | INTRAVENOUS | Status: AC
  Administered 2016-02-05: 6 mg via INTRAVENOUS
  Filled 2016-02-05: qty 2

## 2016-02-05 MED ORDER — SODIUM CHLORIDE 0.9 % IJ SOLN
INTRAMUSCULAR | Status: AC
  Filled 2016-02-05: qty 50

## 2016-02-05 MED ORDER — SODIUM CHLORIDE 0.9 % IV SOLN
3.0000 g | Freq: Four times a day (QID) | INTRAVENOUS | Status: DC
  Administered 2016-02-06 (×3): 3 g via INTRAVENOUS
  Filled 2016-02-05 (×5): qty 3

## 2016-02-05 MED ORDER — SODIUM CHLORIDE 0.9 % IJ SOLN
INTRAMUSCULAR | Status: AC
Start: 2016-02-05 — End: 2016-02-06
  Filled 2016-02-05: qty 50

## 2016-02-05 MED ORDER — CEFTRIAXONE SODIUM 2 G IJ SOLR
2.0000 g | Freq: Once | INTRAMUSCULAR | Status: AC
  Administered 2016-02-05: 2 g via INTRAVENOUS
  Filled 2016-02-05: qty 2

## 2016-02-05 NOTE — ED Notes (Signed)
Pt still in CT cannot reassess pain/nausea at this time.  Will reassess once pt is in the room.

## 2016-02-05 NOTE — ED Notes (Signed)
EKG given to EDP,Campos,MD., for review. 

## 2016-02-05 NOTE — ED Notes (Signed)
Patient transported to CT 

## 2016-02-05 NOTE — ED Notes (Signed)
Patient transported to X-ray 

## 2016-02-05 NOTE — ED Provider Notes (Signed)
[facility] DEPT Provider Note   CSN: [id number] Arrival date & time: 02/05/16  1619     History   Chief Complaint Chief Complaint  Patient presents with  . Abdominal Pain    RLQ    HPI Glenn Herrera is a 65 y.o. male.  HPI Pt presents with worsening abdominal pain today, now localized in the RLQ. Never had pain like this before. Ate normally today. No fever or chills. No urinary symptoms. No diarrhea. Hx of DM. No flank pain. Pt and family concerned about appendicitis   Past Medical History:  Diagnosis Date  . Cancer ([place])   . Diabetes mellitus without complication ([place])   . High blood cholesterol   . Hypertension     Patient Active Problem List   Diagnosis Date Noted  . Left inguinal hernia 04/06/2013    Past Surgical History:  Procedure Laterality Date  . PROSTATE SURGERY         Home Medications    Prior to Admission medications   Medication Sig Start Date End Date Taking? Authorizing Provider  allopurinol (ZYLOPRIM) 300 MG tablet Take 300 mg by mouth at bedtime.   Yes Historical Provider, MD  aspirin 325 MG tablet Take 325 mg by mouth at bedtime.   Yes Historical Provider, MD  benazepril-hydrochlorthiazide (LOTENSIN HCT) 20-12.5 MG per tablet Take 1 tablet by mouth at bedtime.   Yes Historical Provider, MD  glimepiride (AMARYL) 1 MG tablet Take 1 mg by mouth every evening.   Yes Historical Provider, MD  metFORMIN (GLUCOPHAGE-XR) 500 MG 24 hr tablet Take 1,000 mg by mouth at bedtime.   Yes Historical Provider, MD  omeprazole (PRILOSEC) 20 MG capsule Take 20 mg by mouth daily as needed (heart burn).   Yes Historical Provider, MD  simvastatin (ZOCOR) 20 MG tablet Take 20 mg by mouth at bedtime.   Yes Historical Provider, MD  pantoprazole (PROTONIX) 40 MG tablet Take 1 tablet (40 mg total) by mouth daily. Patient not taking: Reported on 02/05/2016 03/13/13   Sherwood Gambler, MD    Family History No family history on file.  Social History Social  History  Substance Use Topics  . Smoking status: Never Smoker  . Smokeless tobacco: Never Used  . Alcohol use No     Allergies   Patient has no known allergies.   Review of Systems Review of Systems  All other systems reviewed and are negative.    Physical Exam Updated Vital Signs BP 124/65   Pulse 81   Temp 98.3 F (36.8 C) (Oral)   Resp 18   SpO2 96%   Physical Exam  Constitutional: He is oriented to person, place, and time. He appears well-developed and well-nourished.  HENT:  Head: Normocephalic and atraumatic.  Eyes: EOM are normal.  Neck: Normal range of motion.  Cardiovascular: Normal rate, regular rhythm, normal heart sounds and intact distal pulses.   Pulmonary/Chest: Effort normal and breath sounds normal. No respiratory distress.  Abdominal: Soft. He exhibits no distension.  Focal tenderness in the RLQ  Musculoskeletal: Normal range of motion.  Neurological: He is alert and oriented to person, place, and time.  Skin: Skin is warm and dry.  Psychiatric: He has a normal mood and affect. Judgment normal.  Nursing note and vitals reviewed.    ED Treatments / Results  Labs (all labs ordered are listed, but only abnormal results are displayed) Labs Reviewed  COMPREHENSIVE METABOLIC PANEL - Abnormal; Notable for the following:  Result Value   Glucose, Bld 217 (*)    All other components within normal limits  CBC - Abnormal; Notable for the following:    WBC 11.8 (*)    HCT 38.7 (*)    All other components within normal limits  URINALYSIS, ROUTINE W REFLEX MICROSCOPIC (NOT AT [facility]) - Abnormal; Notable for the following:    Glucose, UA 100 (*)    Protein, ur 30 (*)    All other components within normal limits  URINE MICROSCOPIC-ADD ON - Abnormal; Notable for the following:    Squamous Epithelial / LPF 6-30 (*)    Bacteria, UA RARE (*)    All other components within normal limits  LIPASE, BLOOD    EKG  EKG Interpretation None        Radiology Ct Abdomen Pelvis W Contrast  Addendum Date: 02/05/2016   ADDENDUM REPORT: 02/05/2016 22:14 ADDENDUM: Acute findings discussed with and reconfirmed by Dr.Lavon Bothwell on 02/05/2016 at 10:14 pm. Electronically Signed   By: Elon Alas M.D.   On: 02/05/2016 22:14   Result Date: 02/05/2016 CLINICAL DATA:  RIGHT lower quadrant pain radiating to groin. History of prostate cancer, diabetes, hypertension. EXAM: CT ABDOMEN AND PELVIS WITH CONTRAST TECHNIQUE: Multidetector CT imaging of the abdomen and pelvis was performed using the standard protocol following bolus administration of intravenous contrast. CONTRAST:  161mL ISOVUE-300 IOPAMIDOL (ISOVUE-300) INJECTION 61% COMPARISON:  CT abdomen and pelvis March 13, 2013 FINDINGS: LOWER CHEST: Stable 6 mm RIGHT middle lobe and 3 mm lingular pulmonary nodules. Heart size is normal. No pericardial effusions. HEPATOBILIARY: The liver is diffusely hypodense compatible with steatosis with mild focal fatty sparing about the gallbladder fossa. PANCREAS: Normal. SPLEEN: Normal. ADRENALS/URINARY TRACT: Kidneys are orthotopic, demonstrating symmetric enhancement. No nephrolithiasis, hydronephrosis or solid renal masses. The unopacified ureters are normal in course and caliber. Delayed imaging through the kidneys demonstrates symmetric prompt contrast excretion within the proximal urinary collecting system. Urinary bladder is partially distended and unremarkable. Normal adrenal glands. STOMACH/BOWEL: The stomach, small and large bowel are normal in course and caliber without inflammatory changes. The appendix is enlarged, 15 mm with mild periappendiceal inflammation, no appendicolith. VASCULAR/LYMPHATIC: Aortoiliac vessels are normal in course and caliber, mild calcific atherosclerosis. No lymphadenopathy by CT size criteria. REPRODUCTIVE: Status post prostatectomy. OTHER: No intraperitoneal free fluid or free air. Mild "misty" mesentery with small lymph  nodes is likely reactive. MUSCULOSKELETAL: Inflammatory changes distal LEFT spermatic cord, present on prior imaging. Small bilateral fat containing umbilical hernias. Mild degenerative change of the hips. Grade 1 L4-5 anterolisthesis on degenerative basis with severe lower lumbar facet arthropathy. Anterior pelvic wall scarring. IMPRESSION: Acute appendicitis without complication. Persistent, partially imaged inflammation of the distal spermatic cord. Electronically Signed: By: Elon Alas M.D. On: 02/05/2016 22:06    Procedures Procedures (including critical care time)  Medications Ordered in ED Medications  ondansetron (ZOFRAN) injection 4 mg (4 mg Intravenous Given 02/05/16 2103)  0.9 %  sodium chloride infusion (0 mLs Intravenous Stopped 02/05/16 2159)    Followed by  0.9 %  sodium chloride infusion (1,000 mLs Intravenous New Bag/Given 02/05/16 2217)  iopamidol (ISOVUE-300) 61 % injection (not administered)  sodium chloride 0.9 % injection (not administered)  iopamidol (ISOVUE-300) 61 % injection (not administered)  sodium chloride 0.9 % injection (not administered)  morphine 4 MG/ML injection 6 mg (6 mg Intravenous Given 02/05/16 2102)  iopamidol (ISOVUE-300) 61 % injection 100 mL (100 mLs Intravenous Contrast Given 02/05/16 2127)     Initial Impression /  Assessment and Plan / ED Course  I have reviewed the triage vital signs and the nursing notes.  Pertinent labs & imaging results that were available during my care of the patient were reviewed by me and considered in my medical decision making (see chart for details).  Clinical Course     Uncomplicated appendicitis. Will contact general surgery for admission   Final Clinical Impressions(s) / ED Diagnoses   Final diagnoses:  Acute appendicitis, unspecified acute appendicitis type    New Prescriptions New Prescriptions   No medications on file     Jola Schmidt, MD 02/05/16 2243

## 2016-02-05 NOTE — ED Triage Notes (Signed)
Pt reports RLQ pain radiating to groin area. denies urinary symptoms. Denies NVD.

## 2016-02-06 ENCOUNTER — Observation Stay (HOSPITAL_COMMUNITY): Payer: 59 | Admitting: Anesthesiology

## 2016-02-06 ENCOUNTER — Encounter (HOSPITAL_COMMUNITY)
Admission: EM | Disposition: A | Discharge status: Home / Self Care | Payer: Self-pay | Source: Home / Self Care | Attending: Emergency Medicine

## 2016-02-06 ENCOUNTER — Encounter (HOSPITAL_COMMUNITY): Payer: Self-pay

## 2016-02-06 HISTORY — PX: LAPAROSCOPIC APPENDECTOMY: SHX408

## 2016-02-06 LAB — GLUCOSE, CAPILLARY
GLUCOSE-CAPILLARY: 190 mg/dL — AB (ref 65–99)
GLUCOSE-CAPILLARY: 150 mg/dL — AB (ref 65–99)
Glucose-Capillary: 139 mg/dL — ABNORMAL HIGH (ref 65–99)
Glucose-Capillary: 149 mg/dL — ABNORMAL HIGH (ref 65–99)
Glucose-Capillary: 151 mg/dL — ABNORMAL HIGH (ref 65–99)

## 2016-02-06 LAB — SURGICAL PCR SCREEN
MRSA, PCR: NEGATIVE
Staphylococcus aureus: NEGATIVE

## 2016-02-06 SURGERY — APPENDECTOMY, LAPAROSCOPIC
Anesthesia: General

## 2016-02-06 MED ORDER — PROMETHAZINE HCL 25 MG/ML IJ SOLN: 6.2500 mg | INTRAMUSCULAR | Status: DC | PRN

## 2016-02-06 MED ORDER — HYDROCHLOROTHIAZIDE 12.5 MG PO CAPS
12.5000 mg | ORAL_CAPSULE | Freq: Every day | ORAL | Status: DC
  Administered 2016-02-06: 12.5 mg via ORAL
  Filled 2016-02-06: qty 1

## 2016-02-06 MED ORDER — BENAZEPRIL HCL 20 MG PO TABS
20.0000 mg | ORAL_TABLET | Freq: Every day | ORAL | Status: DC
  Administered 2016-02-06: 20 mg via ORAL
  Filled 2016-02-06: qty 1

## 2016-02-06 MED ORDER — ONDANSETRON HCL 4 MG/2ML IJ SOLN
INTRAMUSCULAR | Status: AC
Start: 2016-02-06 — End: 2016-02-06
  Filled 2016-02-06: qty 2

## 2016-02-06 MED ORDER — ENOXAPARIN SODIUM 40 MG/0.4ML ~~LOC~~ SOLN
40.0000 mg | SUBCUTANEOUS | Status: DC
  Administered 2016-02-06: 40 mg via SUBCUTANEOUS
  Filled 2016-02-06 (×2): qty 0.4

## 2016-02-06 MED ORDER — LIDOCAINE 2% (20 MG/ML) 5 ML SYRINGE
INTRAMUSCULAR | Status: AC
  Filled 2016-02-06: qty 5

## 2016-02-06 MED ORDER — DIPHENHYDRAMINE HCL 50 MG/ML IJ SOLN: 12.5000 mg | Freq: Four times a day (QID) | INTRAMUSCULAR | Status: DC | PRN

## 2016-02-06 MED ORDER — BUPIVACAINE HCL (PF) 0.25 % IJ SOLN
INTRAMUSCULAR | Status: AC
  Filled 2016-02-06: qty 30

## 2016-02-06 MED ORDER — SUGAMMADEX SODIUM 500 MG/5ML IV SOLN
INTRAVENOUS | Status: DC | PRN
  Administered 2016-02-06: 500 mg via INTRAVENOUS

## 2016-02-06 MED ORDER — MIDAZOLAM HCL 5 MG/5ML IJ SOLN
INTRAMUSCULAR | Status: DC | PRN
  Administered 2016-02-06: 2 mg via INTRAVENOUS

## 2016-02-06 MED ORDER — INSULIN ASPART 100 UNIT/ML ~~LOC~~ SOLN
0.0000 [IU] | Freq: Three times a day (TID) | SUBCUTANEOUS | Status: DC
  Administered 2016-02-06 – 2016-02-07 (×2): 3 [IU] via SUBCUTANEOUS

## 2016-02-06 MED ORDER — ALLOPURINOL 300 MG PO TABS
300.0000 mg | ORAL_TABLET | Freq: Every day | ORAL | Status: DC
  Administered 2016-02-06: 300 mg via ORAL
  Filled 2016-02-06: qty 1

## 2016-02-06 MED ORDER — OXYCODONE-ACETAMINOPHEN 5-325 MG PO TABS
1.0000 | ORAL_TABLET | ORAL | Status: DC | PRN
  Administered 2016-02-06: 1 via ORAL
  Filled 2016-02-06: qty 1

## 2016-02-06 MED ORDER — SUCCINYLCHOLINE CHLORIDE 200 MG/10ML IV SOSY
PREFILLED_SYRINGE | INTRAVENOUS | Status: AC
Start: 2016-02-06 — End: 2016-02-06
  Filled 2016-02-06: qty 10

## 2016-02-06 MED ORDER — ROCURONIUM BROMIDE 10 MG/ML (PF) SYRINGE
PREFILLED_SYRINGE | INTRAVENOUS | Status: DC | PRN
  Administered 2016-02-06: 50 mg via INTRAVENOUS

## 2016-02-06 MED ORDER — SUCCINYLCHOLINE CHLORIDE 200 MG/10ML IV SOSY
PREFILLED_SYRINGE | INTRAVENOUS | Status: DC | PRN
  Administered 2016-02-06: 140 mg via INTRAVENOUS

## 2016-02-06 MED ORDER — HYDROMORPHONE HCL 1 MG/ML IJ SOLN
0.2500 mg | INTRAMUSCULAR | Status: DC | PRN
  Administered 2016-02-06 (×2): 0.5 mg via INTRAVENOUS

## 2016-02-06 MED ORDER — MIDAZOLAM HCL 2 MG/2ML IJ SOLN
INTRAMUSCULAR | Status: AC
  Filled 2016-02-06: qty 2

## 2016-02-06 MED ORDER — SUGAMMADEX SODIUM 500 MG/5ML IV SOLN
INTRAVENOUS | Status: AC
  Filled 2016-02-06: qty 5

## 2016-02-06 MED ORDER — PROPOFOL 10 MG/ML IV BOLUS
INTRAVENOUS | Status: DC | PRN
  Administered 2016-02-06: 200 mg via INTRAVENOUS

## 2016-02-06 MED ORDER — PANTOPRAZOLE SODIUM 40 MG PO TBEC
80.0000 mg | DELAYED_RELEASE_TABLET | Freq: Every day | ORAL | Status: DC
  Administered 2016-02-07: 80 mg via ORAL
  Filled 2016-02-06: qty 2

## 2016-02-06 MED ORDER — MEPERIDINE HCL 50 MG/ML IJ SOLN: 6.2500 mg | INTRAMUSCULAR | Status: DC | PRN

## 2016-02-06 MED ORDER — LIDOCAINE 2% (20 MG/ML) 5 ML SYRINGE
INTRAMUSCULAR | Status: DC | PRN
  Administered 2016-02-06: 100 mg via INTRAVENOUS

## 2016-02-06 MED ORDER — METFORMIN HCL ER 500 MG PO TB24: 1000.0000 mg | ORAL_TABLET | Freq: Every day | ORAL | Status: DC

## 2016-02-06 MED ORDER — GLIMEPIRIDE 1 MG PO TABS
1.0000 mg | ORAL_TABLET | Freq: Every evening | ORAL | Status: DC
  Filled 2016-02-06: qty 1

## 2016-02-06 MED ORDER — ROCURONIUM BROMIDE 50 MG/5ML IV SOSY
PREFILLED_SYRINGE | INTRAVENOUS | Status: AC
  Filled 2016-02-06: qty 5

## 2016-02-06 MED ORDER — BUPIVACAINE HCL (PF) 0.25 % IJ SOLN
INTRAMUSCULAR | Status: DC | PRN
  Administered 2016-02-06: 30 mL

## 2016-02-06 MED ORDER — SIMVASTATIN 20 MG PO TABS
20.0000 mg | ORAL_TABLET | Freq: Every day | ORAL | Status: DC
  Administered 2016-02-06: 20 mg via ORAL
  Filled 2016-02-06: qty 1

## 2016-02-06 MED ORDER — FENTANYL CITRATE (PF) 100 MCG/2ML IJ SOLN
INTRAMUSCULAR | Status: AC
  Filled 2016-02-06: qty 2

## 2016-02-06 MED ORDER — LACTATED RINGERS IR SOLN
Status: DC | PRN
  Administered 2016-02-06: 3000 mL

## 2016-02-06 MED ORDER — HYDROMORPHONE HCL 1 MG/ML IJ SOLN
INTRAMUSCULAR | Status: AC
  Filled 2016-02-06: qty 1

## 2016-02-06 MED ORDER — HYDROMORPHONE HCL 1 MG/ML IJ SOLN: 1.0000 mg | INTRAMUSCULAR | Status: AC | PRN

## 2016-02-06 MED ORDER — ONDANSETRON HCL 4 MG/2ML IJ SOLN
INTRAMUSCULAR | Status: DC | PRN
  Administered 2016-02-06: 4 mg via INTRAVENOUS

## 2016-02-06 MED ORDER — BENAZEPRIL-HYDROCHLOROTHIAZIDE 20-12.5 MG PO TABS: 1.0000 | ORAL_TABLET | Freq: Every day | ORAL | Status: DC

## 2016-02-06 MED ORDER — DIPHENHYDRAMINE HCL 12.5 MG/5ML PO ELIX: 12.5000 mg | ORAL_SOLUTION | Freq: Four times a day (QID) | ORAL | Status: DC | PRN

## 2016-02-06 MED ORDER — FENTANYL CITRATE (PF) 100 MCG/2ML IJ SOLN
INTRAMUSCULAR | Status: DC | PRN
  Administered 2016-02-06: 100 ug via INTRAVENOUS

## 2016-02-06 MED ORDER — LACTATED RINGERS IV SOLN
INTRAVENOUS | Status: DC
  Administered 2016-02-06: 1000 mL via INTRAVENOUS
  Administered 2016-02-06: 12:00:00 via INTRAVENOUS

## 2016-02-06 MED ORDER — PROPOFOL 10 MG/ML IV BOLUS
INTRAVENOUS | Status: AC
Start: 2016-02-06 — End: 2016-02-06
  Filled 2016-02-06: qty 20

## 2016-02-06 SURGICAL SUPPLY — 39 items
APPLIER CLIP 5 13 M/L LIGAMAX5 (MISCELLANEOUS)
APPLIER CLIP ROT 10 11.4 M/L (STAPLE)
CABLE HIGH FREQUENCY MONO STRZ (ELECTRODE) ×2 IMPLANT
CHLORAPREP W/TINT 26ML (MISCELLANEOUS) ×2 IMPLANT
CLIP APPLIE 5 13 M/L LIGAMAX5 (MISCELLANEOUS) IMPLANT
CLIP APPLIE ROT 10 11.4 M/L (STAPLE) IMPLANT
COVER SURGICAL LIGHT HANDLE (MISCELLANEOUS) ×2 IMPLANT
CUTTER FLEX LINEAR 45M (STAPLE) ×2 IMPLANT
DECANTER SPIKE VIAL GLASS SM (MISCELLANEOUS) IMPLANT
DERMABOND ADVANCED (GAUZE/BANDAGES/DRESSINGS) ×1
DERMABOND ADVANCED .7 DNX12 (GAUZE/BANDAGES/DRESSINGS) ×1 IMPLANT
DRAPE LAPAROSCOPIC ABDOMINAL (DRAPES) IMPLANT
DRSG TEGADERM 2-3/8X2-3/4 SM (GAUZE/BANDAGES/DRESSINGS) ×2 IMPLANT
ELECT REM PT RETURN 9FT ADLT (ELECTROSURGICAL) ×2
ELECTRODE REM PT RTRN 9FT ADLT (ELECTROSURGICAL) ×1 IMPLANT
GAUZE SPONGE 2X2 8PLY STRL LF (GAUZE/BANDAGES/DRESSINGS) ×1 IMPLANT
GLOVE BIOGEL PI IND STRL 7.5 (GLOVE) ×1 IMPLANT
GLOVE BIOGEL PI INDICATOR 7.5 (GLOVE) ×1
GLOVE ECLIPSE 7.5 STRL STRAW (GLOVE) ×2 IMPLANT
GOWN STRL REUS W/TWL XL LVL3 (GOWN DISPOSABLE) ×6 IMPLANT
IRRIG SUCT STRYKERFLOW 2 WTIP (MISCELLANEOUS) ×2
IRRIGATION SUCT STRKRFLW 2 WTP (MISCELLANEOUS) ×1 IMPLANT
KIT BASIN OR (CUSTOM PROCEDURE TRAY) ×2 IMPLANT
NS IRRIG 1000ML POUR BTL (IV SOLUTION) IMPLANT
POUCH SPECIMEN RETRIEVAL 10MM (ENDOMECHANICALS) ×2 IMPLANT
RELOAD 45 VASCULAR/THIN (ENDOMECHANICALS) IMPLANT
RELOAD STAPLE TA45 3.5 REG BLU (ENDOMECHANICALS) ×2 IMPLANT
SCISSORS LAP 5X35 DISP (ENDOMECHANICALS) ×2 IMPLANT
SHEARS HARMONIC ACE PLUS 36CM (ENDOMECHANICALS) IMPLANT
SLEEVE XCEL OPT CAN 5 100 (ENDOMECHANICALS) ×2 IMPLANT
SPONGE GAUZE 2X2 STER 10/PKG (GAUZE/BANDAGES/DRESSINGS) ×1
SUT MNCRL AB 4-0 PS2 18 (SUTURE) ×2 IMPLANT
TOWEL OR 17X26 10 PK STRL BLUE (TOWEL DISPOSABLE) ×2 IMPLANT
TOWEL OR NON WOVEN STRL DISP B (DISPOSABLE) ×2 IMPLANT
TRAY FOLEY W/METER SILVER 16FR (SET/KITS/TRAYS/PACK) ×2 IMPLANT
TRAY LAPAROSCOPIC (CUSTOM PROCEDURE TRAY) ×2 IMPLANT
TROCAR BLADELESS OPT 5 100 (ENDOMECHANICALS) ×2 IMPLANT
TROCAR XCEL BLUNT TIP 100MML (ENDOMECHANICALS) ×2 IMPLANT
TUBING INSUF HEATED (TUBING) ×2 IMPLANT

## 2016-02-06 NOTE — Anesthesia Postprocedure Evaluation (Signed)
Anesthesia Post Note  Patient: Glenn Herrera  Procedure(s) Performed: Procedure(s) (LRB): APPENDECTOMY LAPAROSCOPIC (N/A)  Patient location during evaluation: PACU Anesthesia Type: General Level of consciousness: sedated and patient cooperative Pain management: pain level controlled Vital Signs Assessment: post-procedure vital signs reviewed and stable Respiratory status: spontaneous breathing Cardiovascular status: stable Anesthetic complications: no    Last Vitals:  Vitals:   02/06/16 1312 02/06/16 1327  BP: (!) 145/79 129/65  Pulse: 72 72  Resp: 15 14  Temp: 36.5 C 36.7 C    Last Pain:  Vitals:   02/06/16 1312  TempSrc:   PainSc: [place]

## 2016-02-06 NOTE — Anesthesia Procedure Notes (Signed)
Procedure Name: Intubation Date/Time: 02/06/2016 10:40 AM Performed by: Lind Covert Pre-anesthesia Checklist: Patient identified, Timeout performed, Emergency Drugs available, Suction available and Patient being monitored Patient Re-evaluated:Patient Re-evaluated prior to inductionOxygen Delivery Method: Circle system utilized Preoxygenation: Pre-oxygenation with 100% oxygen Intubation Type: IV induction Laryngoscope Size: Mac Grade View: Grade I Tube type: Oral Tube size: 7.5 mm Number of attempts: 1 Airway Equipment and Method: Stylet Placement Confirmation: ETT inserted through vocal cords under direct vision,  positive ETCO2 and breath sounds checked- equal and bilateral Secured at: 22 cm Tube secured with: Tape Dental Injury: Teeth and Oropharynx as per pre-operative assessment

## 2016-02-06 NOTE — H&P (Signed)
Glenn Herrera is an 65 y.o. male.   Chief Complaint: RLQ abdominal pain HPI: A 65 year old male who presents to the emergency department with acute onset of right lower quadrant pain. He denies any nausea or vomiting. He denies any changes in bowel habits.  Past Medical History:  Diagnosis Date  . Cancer ([place])   . Diabetes mellitus without complication ([place])   . High blood cholesterol   . Hypertension     Past Surgical History:  Procedure Laterality Date  . PROSTATE SURGERY      No family history on file. Social History:  reports that he has never smoked. He has never used smokeless tobacco. He reports that he does not drink alcohol or use drugs.  Allergies: No Known Allergies  Medications Prior to Admission  Medication Sig Dispense Refill  . allopurinol (ZYLOPRIM) 300 MG tablet Take 300 mg by mouth at bedtime.    Marland Kitchen aspirin 325 MG tablet Take 325 mg by mouth at bedtime.    . benazepril-hydrochlorthiazide (LOTENSIN HCT) 20-12.5 MG per tablet Take 1 tablet by mouth at bedtime.    Marland Kitchen glimepiride (AMARYL) 1 MG tablet Take 1 mg by mouth every evening.    . metFORMIN (GLUCOPHAGE-XR) 500 MG 24 hr tablet Take 1,000 mg by mouth at bedtime.    Marland Kitchen omeprazole (PRILOSEC) 20 MG capsule Take 20 mg by mouth daily as needed (heart burn).    . simvastatin (ZOCOR) 20 MG tablet Take 20 mg by mouth at bedtime.    . pantoprazole (PROTONIX) 40 MG tablet Take 1 tablet (40 mg total) by mouth daily. (Patient not taking: Reported on 02/05/2016) 14 tablet 0    Results for orders placed or performed during the hospital encounter of 02/05/16 (from the past 48 hour(s))  Urinalysis, Routine w reflex microscopic     Status: Abnormal   Collection Time: 02/05/16  4:30 PM  Result Value Ref Range   Color, Urine YELLOW YELLOW   APPearance CLEAR CLEAR   Specific Gravity, Urine 1.025 1.005 - 1.030   pH 5.5 5.0 - 8.0   Glucose, UA 100 (A) NEGATIVE mg/dL   Hgb urine dipstick NEGATIVE NEGATIVE   Bilirubin Urine  NEGATIVE NEGATIVE   Ketones, ur NEGATIVE NEGATIVE mg/dL   Protein, ur 30 (A) NEGATIVE mg/dL   Nitrite NEGATIVE NEGATIVE   Leukocytes, UA NEGATIVE NEGATIVE  Urine microscopic-add on     Status: Abnormal   Collection Time: 02/05/16  4:30 PM  Result Value Ref Range   Squamous Epithelial / LPF 6-30 (A) NONE SEEN   WBC, UA 0-5 0 - 5 WBC/hpf   RBC / HPF 0-5 0 - 5 RBC/hpf   Bacteria, UA RARE (A) NONE SEEN   Urine-Other MUCOUS PRESENT   Lipase, blood     Status: None   Collection Time: 02/05/16  4:46 PM  Result Value Ref Range   Lipase 19 11 - 51 U/L  Comprehensive metabolic panel     Status: Abnormal   Collection Time: 02/05/16  4:46 PM  Result Value Ref Range   Sodium 136 135 - 145 mmol/L   Potassium 4.4 3.5 - 5.1 mmol/L   Chloride 101 101 - 111 mmol/L   CO2 27 22 - 32 mmol/L   Glucose, Bld 217 (H) 65 - 99 mg/dL   BUN 17 6 - 20 mg/dL   Creatinine, Ser 0.95 0.61 - 1.24 mg/dL   Calcium 9.3 8.9 - 10.3 mg/dL   Total Protein 7.9 6.5 - 8.1 g/dL  Albumin 4.6 3.5 - 5.0 g/dL   AST 28 15 - 41 U/L   ALT 31 17 - 63 U/L   Alkaline Phosphatase 63 38 - 126 U/L   Total Bilirubin 0.6 0.3 - 1.2 mg/dL   GFR calc non Af Amer >60 >60 mL/min   GFR calc Af Amer >60 >60 mL/min    Comment: (NOTE) The eGFR has been calculated using the CKD EPI equation. This calculation has not been validated in all clinical situations. eGFR's persistently <60 mL/min signify possible Chronic Kidney Disease.    Anion gap 8 5 - 15  CBC     Status: Abnormal   Collection Time: 02/05/16  4:46 PM  Result Value Ref Range   WBC 11.8 (H) 4.0 - 10.5 K/uL   RBC 4.78 4.22 - 5.81 MIL/uL   Hemoglobin 13.2 13.0 - 17.0 g/dL   HCT 38.7 (L) 39.0 - 52.0 %   MCV 81.0 78.0 - 100.0 fL   MCH 27.6 26.0 - 34.0 pg   MCHC 34.1 30.0 - 36.0 g/dL   RDW 14.3 11.5 - 15.5 %   Platelets 153 150 - 400 K/uL  Surgical PCR screen     Status: None   Collection Time: 02/05/16 11:55 PM  Result Value Ref Range   MRSA, PCR NEGATIVE NEGATIVE    Staphylococcus aureus NEGATIVE NEGATIVE    Comment:        The Xpert SA Assay (FDA approved for NASAL specimens in patients over 59 years of age), is one component of a comprehensive surveillance program.  Test performance has been validated by [facility] for patients greater than or equal to 71 year old. It is not intended to diagnose infection nor to guide or monitor treatment.   Glucose, capillary     Status: Abnormal   Collection Time: 02/06/16 12:12 AM  Result Value Ref Range   Glucose-Capillary 139 (H) 65 - 99 mg/dL   Dg Chest 2 View  Result Date: 02/05/2016 CLINICAL DATA:  Preoperative chest radiograph for appendectomy. Initial encounter. EXAM: CHEST  2 VIEW COMPARISON:  Chest radiograph performed 07/06/2003 FINDINGS: The lungs are well-aerated. Mild vascular congestion is noted. There is no evidence of focal opacification, pleural effusion or pneumothorax. The heart is normal in size; the mediastinal contour is within normal limits. No acute osseous abnormalities are seen. IMPRESSION: Mild vascular congestion noted.  Lungs remain grossly clear. Electronically Signed   By: Garald Balding M.D.   On: 02/05/2016 23:28   Ct Abdomen Pelvis W Contrast  Addendum Date: 02/05/2016   ADDENDUM REPORT: 02/05/2016 22:14 ADDENDUM: Acute findings discussed with and reconfirmed by Dr.KEVIN CAMPOS on 02/05/2016 at 10:14 pm. Electronically Signed   By: Elon Alas M.D.   On: 02/05/2016 22:14   Result Date: 02/05/2016 CLINICAL DATA:  RIGHT lower quadrant pain radiating to groin. History of prostate cancer, diabetes, hypertension. EXAM: CT ABDOMEN AND PELVIS WITH CONTRAST TECHNIQUE: Multidetector CT imaging of the abdomen and pelvis was performed using the standard protocol following bolus administration of intravenous contrast. CONTRAST:  122m ISOVUE-300 IOPAMIDOL (ISOVUE-300) INJECTION 61% COMPARISON:  CT abdomen and pelvis March 13, 2013 FINDINGS: LOWER CHEST: Stable 6 mm RIGHT middle  lobe and 3 mm lingular pulmonary nodules. Heart size is normal. No pericardial effusions. HEPATOBILIARY: The liver is diffusely hypodense compatible with steatosis with mild focal fatty sparing about the gallbladder fossa. PANCREAS: Normal. SPLEEN: Normal. ADRENALS/URINARY TRACT: Kidneys are orthotopic, demonstrating symmetric enhancement. No nephrolithiasis, hydronephrosis or solid renal masses. The unopacified ureters  are normal in course and caliber. Delayed imaging through the kidneys demonstrates symmetric prompt contrast excretion within the proximal urinary collecting system. Urinary bladder is partially distended and unremarkable. Normal adrenal glands. STOMACH/BOWEL: The stomach, small and large bowel are normal in course and caliber without inflammatory changes. The appendix is enlarged, 15 mm with mild periappendiceal inflammation, no appendicolith. VASCULAR/LYMPHATIC: Aortoiliac vessels are normal in course and caliber, mild calcific atherosclerosis. No lymphadenopathy by CT size criteria. REPRODUCTIVE: Status post prostatectomy. OTHER: No intraperitoneal free fluid or free air. Mild "misty" mesentery with small lymph nodes is likely reactive. MUSCULOSKELETAL: Inflammatory changes distal LEFT spermatic cord, present on prior imaging. Small bilateral fat containing umbilical hernias. Mild degenerative change of the hips. Grade 1 L4-5 anterolisthesis on degenerative basis with severe lower lumbar facet arthropathy. Anterior pelvic wall scarring. IMPRESSION: Acute appendicitis without complication. Persistent, partially imaged inflammation of the distal spermatic cord. Electronically Signed: By: Elon Alas M.D. On: 02/05/2016 22:06    Review of Systems  Constitutional: Negative for chills and fever.  HENT: Negative for congestion and hearing loss.   Eyes: Negative for blurred vision and double vision.  Respiratory: Negative for cough and shortness of breath.   Cardiovascular: Negative for  chest pain and palpitations.  Gastrointestinal: Positive for abdominal pain. Negative for constipation, diarrhea, nausea and vomiting.  Genitourinary: Negative for dysuria, frequency and urgency.  Musculoskeletal: Negative for myalgias and neck pain.  Skin: Negative for itching and rash.  Neurological: Negative for dizziness and headaches.    Blood pressure (!) 109/57, pulse 71, temperature 99 F (37.2 C), temperature source Oral, resp. rate 18, SpO2 98 %. Physical Exam  Constitutional: He is oriented to person, place, and time. He appears well-developed and well-nourished.  HENT:  Head: Normocephalic and atraumatic.  Eyes: Conjunctivae and EOM are normal. Pupils are equal, round, and reactive to light.  Neck: Normal range of motion. Neck supple.  Cardiovascular: Normal rate and regular rhythm.   Respiratory: Effort normal and breath sounds normal. No respiratory distress.  GI: Soft. He exhibits no distension. There is tenderness (RLQ). There is no rebound and no guarding.  Musculoskeletal: Normal range of motion.  Neurological: He is alert and oriented to person, place, and time.  Skin: Skin is warm and dry.     Assessment/Plan 65 year old male on chronic aspirin therapy presents to the ED with acute appendicitis found on CT scan. His physical exam correlates this. He has been placed on IV antibiotics overnight. Have discussed with on-call surgeon today and plan is to proceed to the operating room later today for laparoscopic appendectomy.  Rosario Adie., MD 81/19/1478, 7:26 AM

## 2016-02-06 NOTE — Transfer of Care (Signed)
Immediate Anesthesia Transfer of Care Note  Patient: Glenn Herrera  Procedure(s) Performed: Procedure(s): APPENDECTOMY LAPAROSCOPIC (N/A)  Patient Location: PACU  Anesthesia Type:General  Level of Consciousness: sedated  Airway & Oxygen Therapy: Patient Spontanous Breathing and Patient connected to face mask oxygen  Post-op Assessment: Report given to RN and Post -op Vital signs reviewed and stable  Post vital signs: Reviewed and stable  Last Vitals:  Vitals:   02/05/16 2347 02/06/16 0514  BP: 128/64 (!) 109/57  Pulse: 73 71  Resp: 18 18  Temp: 37.2 C 37.2 C    Last Pain:  Vitals:   02/06/16 0902  TempSrc:   PainSc: 0-No pain      Patients Stated Pain Goal: 5 ([id number])  Complications: No apparent anesthesia complications

## 2016-02-06 NOTE — Progress Notes (Signed)
Informed Dr. Marcello Moores that patient uses CPAP at home & of patient's diabetic status.  No new orders received.

## 2016-02-06 NOTE — Anesthesia Preprocedure Evaluation (Addendum)
Anesthesia Evaluation  Patient identified by MRN, date of birth, ID band Patient awake    Reviewed: Allergy & Precautions, NPO status , Patient's Chart, lab work & pertinent test results  Airway Mallampati: III  TM Distance: >3 FB Neck ROM: Full    Dental no notable dental hx. (+) Edentulous Upper, Edentulous Lower   Pulmonary neg pulmonary ROS,    Pulmonary exam normal breath sounds clear to auscultation       Cardiovascular hypertension, Normal cardiovascular exam Rhythm:Regular Rate:Normal     Neuro/Psych negative neurological ROS  negative psych ROS   GI/Hepatic negative GI ROS, Neg liver ROS,   Endo/Other  diabetes  Renal/GU negative Renal ROS     Musculoskeletal negative musculoskeletal ROS (+)   Abdominal (+) + obese,   Peds  Hematology negative hematology ROS (+)   Anesthesia Other Findings   Reproductive/Obstetrics negative OB ROS                            Anesthesia Physical Anesthesia Plan  ASA: III and emergent  Anesthesia Plan: General   Post-op Pain Management:    Induction: Intravenous, Rapid sequence and Cricoid pressure planned  Airway Management Planned: Oral ETT  Additional Equipment:   Intra-op Plan:   Post-operative Plan: Extubation in OR  Informed Consent: I have reviewed the patients History and Physical, chart, labs and discussed the procedure including the risks, benefits and alternatives for the proposed anesthesia with the patient or authorized representative who has indicated his/her understanding and acceptance.   Dental advisory given  Plan Discussed with: CRNA  Anesthesia Plan Comments:         Anesthesia Quick Evaluation

## 2016-02-06 NOTE — Op Note (Signed)
Preoperative Diagnosis: Acute appendicitis, unspecified acute appendicitis type [K35.80]  Postoprative Diagnosis: Acute appendicitis, unspecified acute appendicitis type [K35.80]  Procedure: Procedure(s): APPENDECTOMY LAPAROSCOPIC   Surgeon: Excell Seltzer T   Assistants: None  Anesthesia:  General endotracheal anesthesia  Indications: Patient is a 65 year old male who presents with 24 hours of typical periumbilical and then right lower quadrant abdominal pain and has localized right lower quadrant tenderness consistent with appendicitis. CT scan has confirmed the diagnosis. We have recommended proceeding with laparoscopic appendectomy for treatment. I discussed the nature the problem with the patient and the nature of surgery and alternatives and risks of surgery including bleeding, infection, visceral injury or possible need for open surgery. Risks of anesthesia were discussed. He understands and agrees to proceed.    Procedure Detail: Patient was taken to the operating room, placed in the supine position on the operating table, and general endotracheal anesthesia was induced. PAS were in place. He received preoperative IV antibiotics. The abdomen was widely sterilely prepped and draped. Foley catheter was placed. Patient timeout was performed and correct procedure verified. Access was obtained with an open Hassan for [id number] cm supraumbilical incision without difficulty and pneumoperitoneum established. Under direct vision 5 mm trochars were placed in the epigastrium and in the left lower quadrant. Patient was placed in reverse Trendelenburg and rotated to the left. The appendix was localized lying just inferior to the cecum and adherent to the terminal ileum and pelvic sidewall. It was bluntly dissected free and was found to have acute suppurative inflammation but no evidence of gangrene or perforation. The appendix and cecum were mobilized dividing lateral peritoneal attachments. The  mesoappendix was sequentially divided with the Harmonic scalpel until the appendix was completely freed out of the tip of the cecum. This area was relatively less inflamed. The appendix was divided across the tip of the cecum with a single firing of the Endo GIA 45 mm blue load stapler. The staple line was intact and without bleeding. The appendix was placed in an Endo Catch bag and brought out through the umbilical incision. The right lower quadrant and pelvis was thoroughly irrigated until clear and there was no evidence of bleeding or injury or other problems. All CO2 was evacuated trochars removed and the mattress suture was secured at the periumbilical incision. Skin incisions were closed with subcutaneous Monocryl and Dermabond. Sponge needle and instrument counts were correct.    Findings: Acute suppurative appendicitis without perforation or gangrene  Estimated Blood Loss:  Minimal         Drains: None  Blood Given: none          Specimens: Appendix        Complications:  * No complications entered in OR log *         Disposition: PACU - hemodynamically stable.         Condition: stable

## 2016-02-07 ENCOUNTER — Encounter (HOSPITAL_COMMUNITY): Payer: Self-pay | Admitting: *Deleted

## 2016-02-07 LAB — GLUCOSE, CAPILLARY: GLUCOSE-CAPILLARY: 163 mg/dL — AB (ref 65–99)

## 2016-02-07 MED ORDER — GLIMEPIRIDE 1 MG PO TABS: 1.0000 mg | ORAL_TABLET | Freq: Every evening | ORAL | Status: DC

## 2016-02-07 MED ORDER — OXYCODONE-ACETAMINOPHEN 5-325 MG PO TABS
1.0000 | ORAL_TABLET | ORAL | 0 refills | Status: DC | PRN
Start: 2016-02-07 — End: 2016-05-23

## 2016-02-07 MED ORDER — METFORMIN HCL ER 500 MG PO TB24: 1000.0000 mg | ORAL_TABLET | Freq: Every day | ORAL | Status: DC

## 2016-02-07 MED ORDER — METFORMIN HCL ER 500 MG PO TB24
ORAL_TABLET | ORAL | Status: DC
Start: 2016-02-07 — End: 2016-05-23

## 2016-02-07 MED ORDER — ASPIRIN 325 MG PO TABS: 325.0000 mg | ORAL_TABLET | Freq: Every day | ORAL | Status: DC

## 2016-02-07 MED ORDER — ACETAMINOPHEN 325 MG PO TABS: 650.0000 mg | ORAL_TABLET | ORAL | 2 refills | Status: DC | PRN

## 2016-02-07 NOTE — Progress Notes (Signed)
1 Day Post-Op  Subjective: He looks great, and is ready to go home.  Sites look fine.  Objective: Vital signs in last 24 hours: Temp:  [97.7 F (36.5 C)-98.5 F (36.9 C)] 98.4 F (36.9 C) (12/01 0510) Pulse Rate:  [64-85] 64 (12/01 0510) Resp:  [14-20] 18 (12/01 0510) BP: (121-160)/(37-85) 121/37 (12/01 0510) SpO2:  [95 %-100 %] 96 % (12/01 0510) Last BM Date:  (PTA) 240 PO 4000 IV 4150 urine Afebrile, VSS Glucose up no other labs Intake/Output from previous day: 11/30 0701 - 12/01 0700 In: 4240 [P.O.:240; I.V.:4000] Out: 4170 [Urine:4150; Blood:20] Intake/Output this shift: No intake/output data recorded.  General appearance: alert, cooperative and no distress GI: soft, sore, sites all look good, tolerating diet.  Lab Results:   Recent Labs  02/05/16 1646  WBC 11.8*  HGB 13.2  HCT 38.7*  PLT 153    BMET  Recent Labs  02/05/16 1646  NA 136  K 4.4  CL 101  CO2 27  GLUCOSE 217*  BUN 17  CREATININE 0.95  CALCIUM 9.3   PT/INR No results for input(s): LABPROT, INR in the last 72 hours.   Recent Labs Lab 02/05/16 1646  AST 28  ALT 31  ALKPHOS 63  BILITOT 0.6  PROT 7.9  ALBUMIN 4.6     Lipase     Component Value Date/Time   LIPASE 19 02/05/2016 1646     Studies/Results: Dg Chest 2 View  Result Date: 02/05/2016 CLINICAL DATA:  Preoperative chest radiograph for appendectomy. Initial encounter. EXAM: CHEST  2 VIEW COMPARISON:  Chest radiograph performed 07/06/2003 FINDINGS: The lungs are well-aerated. Mild vascular congestion is noted. There is no evidence of focal opacification, pleural effusion or pneumothorax. The heart is normal in size; the mediastinal contour is within normal limits. No acute osseous abnormalities are seen. IMPRESSION: Mild vascular congestion noted.  Lungs remain grossly clear. Electronically Signed   By: Garald Balding M.D.   On: 02/05/2016 23:28   Ct Abdomen Pelvis W Contrast  Addendum Date: 02/05/2016   ADDENDUM  REPORT: 02/05/2016 22:14 ADDENDUM: Acute findings discussed with and reconfirmed by Dr.KEVIN CAMPOS on 02/05/2016 at 10:14 pm. Electronically Signed   By: Elon Alas M.D.   On: 02/05/2016 22:14   Result Date: 02/05/2016 CLINICAL DATA:  RIGHT lower quadrant pain radiating to groin. History of prostate cancer, diabetes, hypertension. EXAM: CT ABDOMEN AND PELVIS WITH CONTRAST TECHNIQUE: Multidetector CT imaging of the abdomen and pelvis was performed using the standard protocol following bolus administration of intravenous contrast. CONTRAST:  147mL ISOVUE-300 IOPAMIDOL (ISOVUE-300) INJECTION 61% COMPARISON:  CT abdomen and pelvis March 13, 2013 FINDINGS: LOWER CHEST: Stable 6 mm RIGHT middle lobe and 3 mm lingular pulmonary nodules. Heart size is normal. No pericardial effusions. HEPATOBILIARY: The liver is diffusely hypodense compatible with steatosis with mild focal fatty sparing about the gallbladder fossa. PANCREAS: Normal. SPLEEN: Normal. ADRENALS/URINARY TRACT: Kidneys are orthotopic, demonstrating symmetric enhancement. No nephrolithiasis, hydronephrosis or solid renal masses. The unopacified ureters are normal in course and caliber. Delayed imaging through the kidneys demonstrates symmetric prompt contrast excretion within the proximal urinary collecting system. Urinary bladder is partially distended and unremarkable. Normal adrenal glands. STOMACH/BOWEL: The stomach, small and large bowel are normal in course and caliber without inflammatory changes. The appendix is enlarged, 15 mm with mild periappendiceal inflammation, no appendicolith. VASCULAR/LYMPHATIC: Aortoiliac vessels are normal in course and caliber, mild calcific atherosclerosis. No lymphadenopathy by CT size criteria. REPRODUCTIVE: Status post prostatectomy. OTHER: No intraperitoneal free fluid  or free air. Mild "misty" mesentery with small lymph nodes is likely reactive. MUSCULOSKELETAL: Inflammatory changes distal LEFT spermatic cord,  present on prior imaging. Small bilateral fat containing umbilical hernias. Mild degenerative change of the hips. Grade 1 L4-5 anterolisthesis on degenerative basis with severe lower lumbar facet arthropathy. Anterior pelvic wall scarring. IMPRESSION: Acute appendicitis without complication. Persistent, partially imaged inflammation of the distal spermatic cord. Electronically Signed: By: Elon Alas M.D. On: 02/05/2016 22:06   Prior to Admission medications   Medication Sig Start Date End Date Taking? Authorizing Provider  allopurinol (ZYLOPRIM) 300 MG tablet Take 300 mg by mouth at bedtime.   Yes Historical Provider, MD  aspirin 325 MG tablet Take 325 mg by mouth at bedtime.   Yes Historical Provider, MD  benazepril-hydrochlorthiazide (LOTENSIN HCT) 20-12.5 MG per tablet Take 1 tablet by mouth at bedtime.   Yes Historical Provider, MD  glimepiride (AMARYL) 1 MG tablet Take 1 mg by mouth every evening.   Yes Historical Provider, MD  metFORMIN (GLUCOPHAGE-XR) 500 MG 24 hr tablet Take 1,000 mg by mouth at bedtime.   Yes Historical Provider, MD  omeprazole (PRILOSEC) 20 MG capsule Take 20 mg by mouth daily as needed (heart burn).   Yes Historical Provider, MD  simvastatin (ZOCOR) 20 MG tablet Take 20 mg by mouth at bedtime.   Yes Historical Provider, MD  pantoprazole (PROTONIX) 40 MG tablet Take 1 tablet (40 mg total) by mouth daily. Patient not taking: Reported on 02/05/2016 03/13/13   Sherwood Gambler, MD    Medications: . allopurinol  300 mg Oral QHS  . benazepril  20 mg Oral QHS   And  . hydrochlorothiazide  12.5 mg Oral QHS  . enoxaparin (LOVENOX) injection  40 mg Subcutaneous Q24H  . insulin aspart  0-15 Units Subcutaneous TID WC  . pantoprazole  80 mg Oral Daily  . simvastatin  20 mg Oral QHS   . sodium chloride 1,000 mL (02/06/16 0050)    Assessment/Plan Acute appendicitis S/p laparoscopic appendectomy 02/06/16, Dr. Excell Seltzer AODM Hypertension  Hx of prostate  cancer Hyperlipidemia FEN: Diabetic/IV fluids ID:  Rocephin 1 dose DTV:  lovenox   Plan:  Home this Am.   LOS: 1 day    Yanelli Zapanta 02/07/2016 [phone]

## 2016-02-07 NOTE — Discharge Instructions (Signed)
[facility] ______[facility] °LAPAROSCOPIC SURGERY: POST OP INSTRUCTIONS °Always review your discharge instruction sheet given to you by the facility where your surgery was performed. °IF YOU HAVE DISABILITY OR FAMILY LEAVE FORMS, YOU MUST BRING THEM TO THE OFFICE FOR PROCESSING.   °DO NOT GIVE THEM TO YOUR DOCTOR. ° °1. A prescription for pain medication may be given to you upon discharge.  Take your pain medication as prescribed, if needed.  If narcotic pain medicine is not needed, then you may take acetaminophen (Tylenol) or ibuprofen (Advil) as needed. °2. Take your usually prescribed medications unless otherwise directed. °3. If you need a refill on your pain medication, please contact your pharmacy.  They will contact our office to request authorization. Prescriptions will not be filled after 5pm or on week-ends. °4. You should follow a light diet the first few days after arrival home, such as soup and crackers, etc.  Be sure to include lots of fluids daily. °5. Most patients will experience some swelling and bruising in the area of the incisions.  Ice packs will help.  Swelling and bruising can take several days to resolve.  °6. It is common to experience some constipation if taking pain medication after surgery.  Increasing fluid intake and taking a stool softener (such as Colace) will usually help or prevent this problem from occurring.  A mild laxative (Milk of Magnesia or Miralax) should be taken according to package instructions if there are no bowel movements after 48 hours. °7. Unless discharge instructions indicate otherwise, you may remove your bandages 24-48 hours after surgery, and you may shower at that time.  You may have steri-strips (small skin tapes) in place directly over the incision.  These strips should be left on the skin for 7-10 days.  If your surgeon used skin glue on the incision, you may shower in 24 hours.  The glue will flake off over the next 2-3 weeks.  Any sutures or  staples will be removed at the office during your follow-up visit. °8. ACTIVITIES:  You may resume regular (light) daily activities beginning the next day--such as daily self-care, walking, climbing stairs--gradually increasing activities as tolerated.  You may have sexual intercourse when it is comfortable.  Refrain from any heavy lifting or straining until approved by your doctor. °a. You may drive when you are no longer taking prescription pain medication, you can comfortably wear a seatbelt, and you can safely maneuver your car and apply brakes. °b. RETURN TO WORK:  __________________________________________________________ °9. You should see your doctor in the office for a follow-up appointment approximately 2-3 weeks after your surgery.  Make sure that you call for this appointment within a day or two after you arrive home to insure a convenient appointment time. °10. OTHER INSTRUCTIONS: __________________________________________________________________________________________________________________________ __________________________________________________________________________________________________________________________ °WHEN TO CALL YOUR DOCTOR: °1. Fever over 101.0 °2. Inability to urinate °3. Continued bleeding from incision. °4. Increased pain, redness, or drainage from the incision. °5. Increasing abdominal pain ° °The clinic staff is available to answer your questions during regular business hours.  Please don’t hesitate to call and ask to speak to one of the nurses for clinical concerns.  If you have a medical emergency, go to the nearest emergency room or call 911.  A surgeon from [facility] is always on call at the hospital. °[address], [address], [place], [place]  [address] ? [address] [address], [place], [place]   [address] °[phone] ? [phone] ? FAX [phone] °Web site:   [email or web address] ° ° °Laparoscopic Appendectomy, Adult, Care After °Refer to  this sheet in the next few weeks. These instructions provide you with information about caring for yourself after your procedure. Your health care provider may also give you more specific instructions. Your treatment has been planned according to current medical practices, but problems sometimes occur. Call your health care provider if you have any problems or questions after your procedure. °What can I expect after the procedure? °After the procedure, it is common to have: °· A decrease in your energy level. °· Mild pain in the area where the surgical cuts (incisions) were made. °· Constipation. This can be caused by pain medicine and a decrease in your activity. °Follow these instructions at home: °Medicines  °· Take over-the-counter and prescription medicines only as told by your health care provider. °· Do not drive for 24 hours if you received a sedative. °· Do not drive or operate heavy machinery while taking prescription pain medicine. °· If you were prescribed an antibiotic medicine, take it as told by your health care provider. Do not stop taking the antibiotic even if you start to feel better. °Activity  °· For 3 weeks or as long as told by your health care provider: °¨ Do not lift anything that is heavier than 10 pounds (4.5 kg). °¨ Do not play contact sports. °· Gradually return to your normal activities. Ask your health care provider what activities are safe for you. °Bathing  °· Keep your incisions clean and dry. Clean them as often as told by your health care provider: °¨ Gently wash the incisions with soap and water. °¨ Rinse the incisions with water to remove all soap. °¨ Pat the incisions dry with a clean towel. Do not rub the incisions. °· You may take showers after 48 hours. °· Do not take baths, swim, or use hot tubs for 2 weeks or as told by your health care provider. °Incision care  °· Follow instructions from your healthcare provider about how to take care of your incisions. Make sure  you: °¨ Wash your hands with soap and water before you change your bandage (dressing). If soap and water are not available, use hand sanitizer. °¨ Change your dressing as told by your health care provider. °¨ Leave stitches (sutures), skin glue, or adhesive strips in place. These skin closures may need to stay in place for 2 weeks or longer. If adhesive strip edges start to loosen and curl up, you may trim the loose edges. Do not remove adhesive strips completely unless your health care provider tells you to do that. °· Check your incision areas every day for signs of infection. Check for: °¨ More redness, swelling, or pain. °¨ More fluid or blood. °¨ Warmth. °¨ Pus or a bad smell. °Other Instructions  °· If you were sent home with a drain, follow instructions from your health care provider about how to care for the drain and how to empty it. °· Take deep breaths. This helps to prevent your lungs from becoming inflamed. °· To relieve and prevent constipation: °¨ Drink plenty of fluids. °¨ Eat plenty of fruits and vegetables. °· Keep all follow-up visits as told by your health care provider. This is important. °Contact a health care provider if: °· You have more redness, swelling, or pain around an incision. °· You have more fluid or blood coming from an incision. °· Your incision feels warm to the touch. °· You have pus or a bad smell coming   from an incision or dressing. °· Your incision edges break open after your sutures have been removed. °· You have increasing pain in your shoulders. °· You feel dizzy or you faint. °· You develop shortness of breath. °· You keep feeling nauseous or vomiting. °· You have diarrhea or you cannot control your bowel functions. °· You lose your appetite. °· You develop swelling or pain in your legs. °Get help right away if: °· You have a fever. °· You develop a rash. °· You have difficulty breathing. °· You have sharp pains in your chest. °This information is not intended to replace  advice given to you by your health care provider. Make sure you discuss any questions you have with your health care provider. °Document Released: 02/23/2005 Document Revised: 07/26/2015 Document Reviewed: 08/13/2014 °Elsevier Interactive Patient Education © 2017 [employer] ° °

## 2016-02-07 NOTE — Progress Notes (Signed)
Discharge instructions discussed with patient and wife until no further questions ask. I V removed. Patient voiding tolerating diet and ambulating without difficulty.

## 2016-02-07 NOTE — Discharge Summary (Signed)
Physician Discharge Summary  Patient ID: Glenn Herrera MRN: [id number] DOB/AGE: 63/04/1950 63 y.o.  Admit date: 02/05/2016 Discharge date: 02/07/2016  Admission Diagnoses:  Acute appendicitis AODM Hypertension  Hx of prostate cancer Hyperlipidemia   Discharge Diagnoses:  Acute appendicitis AODM Hypertension  Hx of prostate cancer Hyperlipidemia    Active Problems:   Appendicitis   PROCEDURES: S/p laparoscopic appendectomy 02/06/16, Dr. [facility] Course:   A 65 year old male who presents to the emergency department with acute onset of right lower quadrant pain. He denies any nausea or vomiting. He denies any changes in bowel habits. CTscan was consistent with acute appendicitis.  He was admitted and taken to the OR the following AM.  He tolerated the procedure well.  He was mobilized and his diet was advanced.  PO meds held during his admit.  Glucose treated with sliding scale insulin.  He was tolerating diet, ambulating and ready to go home the following AM.  Condition on d/c: improved    CBC Latest Ref Rng & Units 02/05/2016 03/13/2013  WBC 4.0 - 10.5 K/uL 11.8(H) 10.5  Hemoglobin 13.0 - 17.0 g/dL 13.2 15.6  Hematocrit 39.0 - 52.0 % 38.7(L) 45.6  Platelets 150 - 400 K/uL 153 173     CMP Latest Ref Rng & Units 02/05/2016 03/13/2013  Glucose 65 - 99 mg/dL 217(H) 121(H)  BUN 6 - 20 mg/dL 17 16  Creatinine 0.61 - 1.24 mg/dL 0.95 0.92  Sodium 135 - 145 mmol/L 136 133(L)  Potassium 3.5 - 5.1 mmol/L 4.4 4.5  Chloride 101 - 111 mmol/L 101 93(L)  CO2 22 - 32 mmol/L 27 25  Calcium 8.9 - 10.3 mg/dL 9.3 9.9  Total Protein 6.5 - 8.1 g/dL 7.9 8.3  Total Bilirubin 0.3 - 1.2 mg/dL 0.6 0.6  Alkaline Phos 38 - 126 U/L 63 76  AST 15 - 41 U/L 28 20  ALT 17 - 63 U/L 31 29   Disposition: 01-Home or Self Care     Medication List    STOP taking these medications   pantoprazole 40 MG tablet Commonly known as:  PROTONIX     TAKE these medications   acetaminophen  325 MG tablet Commonly known as:  TYLENOL Take 2 tablets (650 mg total) by mouth every 4 (four) hours as needed.   allopurinol 300 MG tablet Commonly known as:  ZYLOPRIM Take 300 mg by mouth at bedtime.   aspirin 325 MG tablet Take 325 mg by mouth at bedtime.   benazepril-hydrochlorthiazide 20-12.5 MG tablet Commonly known as:  LOTENSIN HCT Take 1 tablet by mouth at bedtime.   glimepiride 1 MG tablet Commonly known as:  AMARYL Take 1 mg by mouth every evening.   metFORMIN 500 MG 24 hr tablet Commonly known as:  GLUCOPHAGE-XR Resume as before and restart at bedtime tonight, 02/07/16. What changed:  how much to take  how to take this  when to take this  additional instructions   omeprazole 20 MG capsule Commonly known as:  PRILOSEC Take 20 mg by mouth daily as needed (heart burn).   oxyCODONE-acetaminophen 5-325 MG tablet Commonly known as:  PERCOCET/ROXICET Take 1-2 tablets by mouth every 4 (four) hours as needed for moderate pain.   simvastatin 20 MG tablet Commonly known as:  ZOCOR Take 20 mg by mouth at bedtime.      Follow-up Information    [facility] Follow up on 03/03/2016.   Specialty:  General Surgery Why:  Your appointment is at 1:30 PM,  be at the office 30 minutes early for check in. Contact information: [address] [place] [address] [phone]        Simona Huh, MD Follow up.   Specialty:  Family Medicine Why:  Call and follow up for medical issues. Contact information: [address] [place] [address] [phone]           Signed: Earnstine Regal 02/07/2016, 2:45 PM

## 2016-05-01 ENCOUNTER — Ambulatory Visit (INDEPENDENT_AMBULATORY_CARE_PROVIDER_SITE_OTHER): Payer: 59

## 2016-05-01 ENCOUNTER — Encounter: Payer: Self-pay | Admitting: Podiatry

## 2016-05-01 ENCOUNTER — Ambulatory Visit (INDEPENDENT_AMBULATORY_CARE_PROVIDER_SITE_OTHER): Payer: 59 | Admitting: Podiatry

## 2016-05-01 DIAGNOSIS — M79672 Pain in left foot: Secondary | ICD-10-CM

## 2016-05-01 DIAGNOSIS — L97529 Non-pressure chronic ulcer of other part of left foot with unspecified severity: Secondary | ICD-10-CM

## 2016-05-02 NOTE — Progress Notes (Signed)
Subjective:     Patient ID: Glenn Herrera, male   DOB: 10/24/1950, 66 y.o.   MRN: [id number]  HPI patient presents with keratotic lesion on the left hallux and states that he's had long-term diabetes and is not in great control and has to wear steel toe shoes at work. States it's been present for around a month   Review of Systems  All other systems reviewed and are negative.      Objective:   Physical Exam  Constitutional: He is oriented to person, place, and time.  Cardiovascular: Intact distal pulses.   Musculoskeletal: Normal range of motion.  Neurological: He is oriented to person, place, and time.  Skin: Skin is warm and dry.  Nursing note and vitals reviewed.  neurovascular status was found to be diminished but intact bilateral with patient noted to have moderate distal dry skin and nail disease. His diabetes is under reasonably good control but he knows he needs to do a better job and is working on it and weight loss at the current time. Patient's found to have keratotic lesion medial side left hallux localized in nature with no current erythema edema or drainage associated with it or odor and patient is noted to have no proximal erythema edema and has normal temperature at the current time with no systemic signs of infection     Assessment:     Localized keratotic lesion with superficial ulceration secondary to pressure and the wearing of steel toed shoes    Plan:     H&P diabetic education rendered to patient and the importance of watching his diet and watching and keeping his sugar good control. Today using sharp instrumentation I debrided the area found one small area of superficial breakdown of tissue with no drainage or odor and I applied Iodosorb to dry it out with sterile dressing. I instructed on soaks and I dispense cushioning to give reduced weightbearing pressure on this area and I gave strict instructions if any redness drainage swelling were to occur to let us know  immediately and that I'm very hopeful this will just resolve simply but I did explain to him there is a possibility that it could get worse and ultimately he may require digital amputation  X-ray indicates no current indications of ostial lysis or soft tissue pathology

## 2016-05-23 ENCOUNTER — Encounter (HOSPITAL_COMMUNITY): Payer: Self-pay | Admitting: Emergency Medicine

## 2016-05-23 ENCOUNTER — Ambulatory Visit (HOSPITAL_COMMUNITY)
Admission: EM | Admit: 2016-05-23 | Discharge: 2016-05-23 | Disposition: A | Discharge status: Home / Self Care | Payer: 59 | Attending: Family Medicine | Admitting: Family Medicine

## 2016-05-23 DIAGNOSIS — Z7982 Long term (current) use of aspirin: Secondary | ICD-10-CM | POA: Insufficient documentation

## 2016-05-23 DIAGNOSIS — R319 Hematuria, unspecified: Secondary | ICD-10-CM | POA: Diagnosis present

## 2016-05-23 DIAGNOSIS — Z7984 Long term (current) use of oral hypoglycemic drugs: Secondary | ICD-10-CM | POA: Diagnosis not present

## 2016-05-23 DIAGNOSIS — Z79899 Other long term (current) drug therapy: Secondary | ICD-10-CM | POA: Insufficient documentation

## 2016-05-23 DIAGNOSIS — R310 Gross hematuria: Secondary | ICD-10-CM | POA: Insufficient documentation

## 2016-05-23 LAB — POCT URINALYSIS DIP (DEVICE)
Glucose, UA: 500 mg/dL — AB
Ketones, ur: 80 mg/dL — AB
NITRITE: POSITIVE — AB
PH: 5 (ref 5.0–8.0)
SPECIFIC GRAVITY, URINE: 1.01 (ref 1.005–1.030)

## 2016-05-23 MED ORDER — CIPROFLOXACIN HCL 500 MG PO TABS: 500.0000 mg | ORAL_TABLET | Freq: Two times a day (BID) | ORAL | 0 refills | Status: DC

## 2016-05-23 MED ORDER — METFORMIN HCL ER 500 MG PO TB24: 1000.0000 mg | ORAL_TABLET | Freq: Two times a day (BID) | ORAL | Status: AC

## 2016-05-23 NOTE — ED Triage Notes (Signed)
This morning noted increased episodes of urination and decreased amounts of urine.  Then noticed blood in urine.  "uncomfortable " in lower abdomen.    History of prostate cancer 16 years ago.

## 2016-05-23 NOTE — ED Provider Notes (Signed)
[facility]    CSN: [id number] Arrival date & time: 05/23/16  1607     History   Chief Complaint Chief Complaint  Patient presents with  . Hematuria    HPI Glenn Herrera is a 66 y.o. male.   This morning noted increased episodes of urination and decreased amounts of urine.  Then noticed blood in urine.  "uncomfortable " in lower abdomen.    History of prostate cancer 16 years ago.      Past Medical History:  Diagnosis Date  . Cancer ([place])   . Diabetes mellitus without complication ([place])   . High blood cholesterol   . Hypertension     Patient Active Problem List   Diagnosis Date Noted  . Appendicitis 02/05/2016  . Left inguinal hernia 04/06/2013    Past Surgical History:  Procedure Laterality Date  . LAPAROSCOPIC APPENDECTOMY N/A 02/06/2016   Procedure: APPENDECTOMY LAPAROSCOPIC;  Surgeon: Excell Seltzer, MD;  Location: [facility];  Service: General;  Laterality: N/A;  . PROSTATE SURGERY         Home Medications    Prior to Admission medications   Medication Sig Start Date End Date Taking? Authorizing Provider  acetaminophen (TYLENOL) 325 MG tablet Take 2 tablets (650 mg total) by mouth every 4 (four) hours as needed. 02/07/16 02/06/17  Earnstine Regal, PA-C  allopurinol (ZYLOPRIM) 300 MG tablet Take 300 mg by mouth at bedtime.    Historical Provider, MD  aspirin 325 MG tablet Take 325 mg by mouth at bedtime.    Historical Provider, MD  benazepril-hydrochlorthiazide (LOTENSIN HCT) 20-12.5 MG per tablet Take 1 tablet by mouth at bedtime.    Historical Provider, MD  ciprofloxacin (CIPRO) 500 MG tablet Take 1 tablet (500 mg total) by mouth 2 (two) times daily. 05/23/16   Robyn Haber, MD  glimepiride (AMARYL) 1 MG tablet Take 2 mg by mouth every evening.     Historical Provider, MD  metFORMIN (GLUCOPHAGE-XR) 500 MG 24 hr tablet Take 2 tablets (1,000 mg total) by mouth 2 times daily at 12 noon and 4 pm. Resume as before and restart at bedtime  tonight, 02/07/16. 05/23/16   Robyn Haber, MD  omeprazole (PRILOSEC) 20 MG capsule Take 20 mg by mouth daily as needed (heart burn).    Historical Provider, MD  simvastatin (ZOCOR) 20 MG tablet Take 20 mg by mouth at bedtime.    Historical Provider, MD    Family History No family history on file.  Social History Social History  Substance Use Topics  . Smoking status: Never Smoker  . Smokeless tobacco: Never Used  . Alcohol use No     Allergies   Patient has no known allergies.   Review of Systems Review of Systems  Constitutional: Negative.   Respiratory: Negative.   Cardiovascular: Negative.   Genitourinary: Positive for hematuria.  Neurological: Negative.      Physical Exam Triage Vital Signs ED Triage Vitals  Enc Vitals Group     BP 05/23/16 1711 (!) 147/84     Pulse Rate 05/23/16 1711 86     Resp --      Temp 05/23/16 1711 98.4 F (36.9 C)     Temp Source 05/23/16 1711 Oral     SpO2 05/23/16 1711 96 %     Weight --      Height --      Head Circumference --      Peak Flow --      Pain Score 05/23/16 1710  10     Pain Loc --      Pain Edu? --      Excl. in [place]? --    No data found.   Updated Vital Signs BP (!) 147/84 (BP Location: Right Arm) Comment (BP Location): large cuff  Pulse 86   Temp 98.4 F (36.9 C) (Oral)   SpO2 96%    Physical Exam  Constitutional: He is oriented to person, place, and time. He appears well-developed and well-nourished.  HENT:  Head: Normocephalic.  Right Ear: External ear normal.  Left Ear: External ear normal.  Eyes: Conjunctivae and EOM are normal. Pupils are equal, round, and reactive to light.  Neck: Normal range of motion. Neck supple.  Cardiovascular: Normal rate and regular rhythm.   Pulmonary/Chest: Effort normal.  Abdominal: Soft. There is no tenderness.  Musculoskeletal: Normal range of motion.  Neurological: He is alert and oriented to person, place, and time.  Skin: Skin is warm and dry.  Nursing  note and vitals reviewed.    UC Treatments / Results  Labs (all labs ordered are listed, but only abnormal results are displayed) Labs Reviewed  POCT URINALYSIS DIP (DEVICE) - Abnormal; Notable for the following:       Result Value   Glucose, UA 500 (*)    Bilirubin Urine LARGE (*)    Ketones, ur 80 (*)    Hgb urine dipstick LARGE (*)    Protein, ur >=300 (*)    Nitrite POSITIVE (*)    Leukocytes, UA LARGE (*)    All other components within normal limits  URINE CULTURE    EKG  EKG Interpretation None       Radiology No results found.  Procedures Procedures (including critical care time)  Medications Ordered in UC Medications - No data to display   Initial Impression / Assessment and Plan / UC Course  I have reviewed the triage vital signs and the nursing notes.  Pertinent labs & imaging results that were available during my care of the patient were reviewed by me and considered in my medical decision making (see chart for details).     Final Clinical Impressions(s) / UC Diagnoses   Final diagnoses:  Gross hematuria    New Prescriptions New Prescriptions   CIPROFLOXACIN (CIPRO) 500 MG TABLET    Take 1 tablet (500 mg total) by mouth 2 (two) times daily.     Robyn Haber, MD 05/23/16 [phone]

## 2016-05-23 NOTE — Discharge Instructions (Signed)
You will need to follow-up with your personal doctor once the infection is under control. If you continue to have significant bleeding and discomfort, please return for further evaluation

## 2016-05-23 NOTE — ED Notes (Signed)
Urine specimen in lab, obtained while patient in lobby

## 2016-05-23 NOTE — ED Triage Notes (Signed)
Pt  Called  Inquiring   About  otc   Pain  Med  Dr   Ladean Raya  adiced  [place]  By  [facility] records

## 2016-05-26 LAB — URINE CULTURE: Culture: >=100000 — AB

## 2016-07-31 ENCOUNTER — Ambulatory Visit (INDEPENDENT_AMBULATORY_CARE_PROVIDER_SITE_OTHER): Payer: 59 | Admitting: Podiatry

## 2016-07-31 ENCOUNTER — Encounter: Payer: Self-pay | Admitting: Podiatry

## 2016-07-31 DIAGNOSIS — L97529 Non-pressure chronic ulcer of other part of left foot with unspecified severity: Secondary | ICD-10-CM

## 2016-08-01 NOTE — Progress Notes (Signed)
Subjective:    Patient ID: Glenn Herrera, male   DOB: 66 y.o.   MRN: [id number]   HPI patient states the left foot was worked on previously is doing really well but the right second digit has broken down slightly and he admits he may have worn in appropriate shoe gear and also lesion on the right big toe    ROS      Objective:  Physical Exam Neurovascular status unchanged with no change in diabetic condition at the current time with distal redness and slight drainage of the right distal toe localized in nature with no proximal edema erythema drainage noted and well-healed site left with keratotic tissue right hallux    Assessment:  Superficial ulceration secondary to friction of the second toe which was probably brought about by activity levels       Plan:    H&P and condition reviewed with education rendered. Today using sterile instrumentation I debrided tissue flushed and applied Iodosorb with dressing and instructed on home soaks and dispensed buttress pad to lift the toes. Patient will be seen back to recheck as needed and was given strict instructions of any redness should occur drainage she is to let us know immediately or go straight to the emergency room and there is always the possibility for amputation associated with this conditions

## 2016-09-15 ENCOUNTER — Inpatient Hospital Stay (HOSPITAL_COMMUNITY)
Admission: EM | Admit: 2016-09-15 | Discharge: 2016-09-20 | DRG: 872 | Disposition: A | Discharge status: Home / Self Care | Payer: 59 | Attending: Internal Medicine | Admitting: Internal Medicine

## 2016-09-15 ENCOUNTER — Emergency Department (HOSPITAL_COMMUNITY): Payer: 59

## 2016-09-15 ENCOUNTER — Encounter (HOSPITAL_COMMUNITY): Payer: Self-pay | Admitting: Emergency Medicine

## 2016-09-15 DIAGNOSIS — A419 Sepsis, unspecified organism: Secondary | ICD-10-CM | POA: Diagnosis not present

## 2016-09-15 DIAGNOSIS — M7989 Other specified soft tissue disorders: Secondary | ICD-10-CM

## 2016-09-15 DIAGNOSIS — Z79899 Other long term (current) drug therapy: Secondary | ICD-10-CM

## 2016-09-15 DIAGNOSIS — I10 Essential (primary) hypertension: Secondary | ICD-10-CM | POA: Diagnosis present

## 2016-09-15 DIAGNOSIS — Z833 Family history of diabetes mellitus: Secondary | ICD-10-CM

## 2016-09-15 DIAGNOSIS — Z8546 Personal history of malignant neoplasm of prostate: Secondary | ICD-10-CM

## 2016-09-15 DIAGNOSIS — Z6839 Body mass index (BMI) 39.0-39.9, adult: Secondary | ICD-10-CM

## 2016-09-15 DIAGNOSIS — E785 Hyperlipidemia, unspecified: Secondary | ICD-10-CM

## 2016-09-15 DIAGNOSIS — G4733 Obstructive sleep apnea (adult) (pediatric): Secondary | ICD-10-CM | POA: Diagnosis present

## 2016-09-15 DIAGNOSIS — Z7984 Long term (current) use of oral hypoglycemic drugs: Secondary | ICD-10-CM

## 2016-09-15 DIAGNOSIS — E669 Obesity, unspecified: Secondary | ICD-10-CM | POA: Diagnosis present

## 2016-09-15 DIAGNOSIS — M609 Myositis, unspecified: Secondary | ICD-10-CM | POA: Diagnosis present

## 2016-09-15 DIAGNOSIS — E871 Hypo-osmolality and hyponatremia: Secondary | ICD-10-CM | POA: Diagnosis present

## 2016-09-15 DIAGNOSIS — E119 Type 2 diabetes mellitus without complications: Secondary | ICD-10-CM

## 2016-09-15 DIAGNOSIS — M79671 Pain in right foot: Secondary | ICD-10-CM

## 2016-09-15 DIAGNOSIS — Z8249 Family history of ischemic heart disease and other diseases of the circulatory system: Secondary | ICD-10-CM

## 2016-09-15 DIAGNOSIS — L0390 Cellulitis, unspecified: Secondary | ICD-10-CM

## 2016-09-15 DIAGNOSIS — L03115 Cellulitis of right lower limb: Secondary | ICD-10-CM | POA: Diagnosis present

## 2016-09-15 DIAGNOSIS — M109 Gout, unspecified: Secondary | ICD-10-CM | POA: Diagnosis present

## 2016-09-15 DIAGNOSIS — E1142 Type 2 diabetes mellitus with diabetic polyneuropathy: Secondary | ICD-10-CM | POA: Diagnosis present

## 2016-09-15 DIAGNOSIS — I159 Secondary hypertension, unspecified: Secondary | ICD-10-CM

## 2016-09-15 DIAGNOSIS — K5900 Constipation, unspecified: Secondary | ICD-10-CM | POA: Diagnosis not present

## 2016-09-15 DIAGNOSIS — E11610 Type 2 diabetes mellitus with diabetic neuropathic arthropathy: Secondary | ICD-10-CM | POA: Diagnosis present

## 2016-09-15 DIAGNOSIS — R6510 Systemic inflammatory response syndrome (SIRS) of non-infectious origin without acute organ dysfunction: Secondary | ICD-10-CM

## 2016-09-15 HISTORY — DX: Hyperlipidemia, unspecified: E78.5

## 2016-09-15 LAB — COMPREHENSIVE METABOLIC PANEL
ALT: 32 U/L (ref 17–63)
ANION GAP: 11 (ref 5–15)
AST: 30 U/L (ref 15–41)
Albumin: 3.8 g/dL (ref 3.5–5.0)
Alkaline Phosphatase: 72 U/L (ref 38–126)
BILIRUBIN TOTAL: 0.6 mg/dL (ref 0.3–1.2)
BUN: 20 mg/dL (ref 6–20)
CHLORIDE: 96 mmol/L — AB (ref 101–111)
CO2: 22 mmol/L (ref 22–32)
Calcium: 8.6 mg/dL — ABNORMAL LOW (ref 8.9–10.3)
Creatinine, Ser: 1.04 mg/dL (ref 0.61–1.24)
GFR calc Af Amer: >60 mL/min (ref >60)
GFR calc non Af Amer: >60 mL/min (ref >60)
GLUCOSE: 156 mg/dL — AB (ref 65–99)
POTASSIUM: 4.6 mmol/L (ref 3.5–5.1)
SODIUM: 129 mmol/L — AB (ref 135–145)
Total Protein: 8.5 g/dL — ABNORMAL HIGH (ref 6.5–8.1)

## 2016-09-15 LAB — CBC WITH DIFFERENTIAL/PLATELET
BASOS ABS: 0 10*3/uL (ref 0.0–0.1)
Basophils Relative: 0 %
EOS PCT: 1 %
Eosinophils Absolute: 0.1 10*3/uL (ref 0.0–0.7)
HEMATOCRIT: 38.3 % — AB (ref 39.0–52.0)
Hemoglobin: 13.3 g/dL (ref 13.0–17.0)
LYMPHS ABS: 1.1 10*3/uL (ref 0.7–4.0)
LYMPHS PCT: 15 %
MCH: 27.3 pg (ref 26.0–34.0)
MCHC: 34.7 g/dL (ref 30.0–36.0)
MCV: 78.5 fL (ref 78.0–100.0)
MONO ABS: 0.7 10*3/uL (ref 0.1–1.0)
MONOS PCT: 10 %
NEUTROS ABS: 5.6 10*3/uL (ref 1.7–7.7)
Neutrophils Relative %: 74 %
PLATELETS: 155 10*3/uL (ref 150–400)
RBC: 4.88 MIL/uL (ref 4.22–5.81)
RDW: 14.5 % (ref 11.5–15.5)
WBC: 7.5 10*3/uL (ref 4.0–10.5)

## 2016-09-15 LAB — I-STAT CG4 LACTIC ACID, ED: Lactic Acid, Venous: 1.21 mmol/L (ref 0.5–1.9)

## 2016-09-15 MED ORDER — SODIUM CHLORIDE 0.9 % IV BOLUS (SEPSIS)
1000.0000 mL | Freq: Once | INTRAVENOUS | Status: AC
  Administered 2016-09-15: 1000 mL via INTRAVENOUS

## 2016-09-15 MED ORDER — PIPERACILLIN-TAZOBACTAM 3.375 G IVPB 30 MIN
3.3750 g | Freq: Once | INTRAVENOUS | Status: AC
  Administered 2016-09-15: 3.375 g via INTRAVENOUS
  Filled 2016-09-15: qty 50

## 2016-09-15 MED ORDER — VANCOMYCIN HCL 10 G IV SOLR
2500.0000 mg | Freq: Once | INTRAVENOUS | Status: AC
  Administered 2016-09-16: 2500 mg via INTRAVENOUS
  Filled 2016-09-15: qty 2000

## 2016-09-15 MED ORDER — SODIUM CHLORIDE 0.9 % IV BOLUS (SEPSIS)
1000.0000 mL | Freq: Once | INTRAVENOUS | Status: AC
  Administered 2016-09-16: 1000 mL via INTRAVENOUS

## 2016-09-15 MED ORDER — VANCOMYCIN HCL IN DEXTROSE 1-5 GM/200ML-% IV SOLN: 1000.0000 mg | Freq: Once | INTRAVENOUS | Status: DC

## 2016-09-15 MED ORDER — ACETAMINOPHEN 325 MG PO TABS
650.0000 mg | ORAL_TABLET | Freq: Once | ORAL | Status: AC | PRN
  Administered 2016-09-15: 650 mg via ORAL
  Filled 2016-09-15: qty 2

## 2016-09-15 NOTE — ED Provider Notes (Signed)
[facility] DEPT Provider Note   CSN: [id number] Arrival date & time: 09/15/16  2102  By signing my name below, I, Theresia Bough, attest that this documentation has been prepared under the direction and in the presence of Thompsons, Dutchess Crosland, MD. Electronically Signed: Theresia Bough, ED Scribe. 09/15/16. 11:50 PM.  History   Chief Complaint Chief Complaint  Patient presents with  . Leg Swelling  . Fever   The history is provided by the patient. No language interpreter was used.  Rash   This is a new problem. The current episode started more than 2 days ago. The problem has been rapidly worsening. The problem is associated with nothing. The maximum temperature recorded prior to his arrival was 102 to 102.9 F. The rash is present on the right lower leg. The pain is moderate. The pain has been constant since onset. Associated symptoms include pain. Treatments tried: antibiotics. The treatment provided no relief.   HPI Comments: Glenn Herrera is a 66 y.o. male with a PMHx of DM, who presents to the Emergency Department complaining of increased, constant leg swelling onset 1 week ago. Pt reports associated redness to the area, fever of 102.2 and constipation for 1 week. Pt went to PCP where the provider diagnoses him with gout and started him on prednisone and Augmentin. Pt denies diarrhea, cough, dysuria or any other complaints at this time.  Past Medical History:  Diagnosis Date  . Cancer [facility])    prostate   . Diabetes mellitus without complication ([place])   . High blood cholesterol   . Hypertension     Patient Active Problem List   Diagnosis Date Noted  . Appendicitis 02/05/2016  . Left inguinal hernia 04/06/2013    Past Surgical History:  Procedure Laterality Date  . LAPAROSCOPIC APPENDECTOMY N/A 02/06/2016   Procedure: APPENDECTOMY LAPAROSCOPIC;  Surgeon: Excell Seltzer, MD;  Location: [facility] ORS;  Service: General;  Laterality: N/A;  . PROSTATE SURGERY         Home  Medications    Prior to Admission medications   Medication Sig Start Date End Date Taking? Authorizing Provider  allopurinol (ZYLOPRIM) 300 MG tablet Take 300 mg by mouth at bedtime.   Yes [provider]  amoxicillin-clavulanate (AUGMENTIN) 875-125 MG tablet Take 1 tablet by mouth 2 (two) times daily.   Yes [provider]  aspirin 325 MG tablet Take 325 mg by mouth at bedtime.   Yes [provider]  benazepril-hydrochlorthiazide (LOTENSIN HCT) 20-12.5 MG per tablet Take 1 tablet by mouth at bedtime.   Yes [provider]  glimepiride (AMARYL) 2 MG tablet Take 2 mg by mouth every evening.   Yes [provider]  HYDROcodone-acetaminophen (NORCO/VICODIN) 5-325 MG tablet Take 2 tablets by mouth every 6 (six) hours as needed for moderate pain or severe pain.   Yes [provider]  metFORMIN (GLUCOPHAGE-XR) 500 MG 24 hr tablet Take 2 tablets (1,000 mg total) by mouth 2 times daily at 12 noon and 4 pm. Resume as before and restart at bedtime tonight, 02/07/16. 05/23/16  Yes Robyn Haber, MD  simvastatin (ZOCOR) 20 MG tablet Take 20 mg by mouth at bedtime.   Yes [provider]  sulfamethoxazole-trimethoprim (BACTRIM DS,SEPTRA DS) 800-160 MG tablet Take 1 tablet by mouth 2 (two) times daily.   Yes [provider]  acetaminophen (TYLENOL) 325 MG tablet Take 2 tablets (650 mg total) by mouth every 4 (four) hours as needed. Patient not taking: Reported on 09/15/2016 02/07/16 02/06/17  Earnstine Regal, PA-C  ciprofloxacin (CIPRO) 500 MG tablet Take 1 tablet (500 mg total) by mouth 2 (two) times daily. Patient not taking: Reported on 09/15/2016 05/23/16   Robyn Haber, MD    Family History No family history on file.  Social History Social History  Substance Use Topics  . Smoking status: Never Smoker  . Smokeless tobacco: Never Used  . Alcohol use No     Allergies   Patient has no known allergies.   Review of  Systems Review of Systems  Constitutional: Positive for fever. Negative for chills.  HENT: Negative for drooling and facial swelling.   Eyes: Negative for photophobia.  Respiratory: Negative for cough and shortness of breath.   Cardiovascular: Positive for leg swelling. Negative for chest pain and palpitations.  Gastrointestinal: Positive for constipation. Negative for anal bleeding and diarrhea.  Genitourinary: Negative for difficulty urinating and dysuria.  Musculoskeletal: Negative for neck stiffness.  Skin: Positive for color change and rash. Negative for pallor.  Neurological: Negative for facial asymmetry and speech difficulty.  Psychiatric/Behavioral: Negative for suicidal ideas.  All other systems reviewed and are negative.    Physical Exam Updated Vital Signs BP 129/60   Pulse 72   Temp (!) 102.2 F (39 C) (Oral)   Resp 19   Ht 5\' 9"  (1.753 m)   Wt 269 lb (122 kg)   SpO2 96%   BMI 39.72 kg/m   Physical Exam  Constitutional: He is oriented to person, place, and time. He appears well-developed and well-nourished.  HENT:  Head: Normocephalic.  Mouth/Throat: Oropharynx is clear and moist. No oropharyngeal exudate.  Eyes: Conjunctivae and EOM are normal. Pupils are equal, round, and reactive to light. Right eye exhibits no discharge. Left eye exhibits no discharge. No scleral icterus.  Neck: Normal range of motion. Neck supple. No JVD present. No tracheal deviation present.  Trachea is midline. No stridor or carotid bruits.  Cardiovascular: Normal rate, regular rhythm, normal heart sounds and intact distal pulses.   No murmur heard. Pulmonary/Chest: Effort normal and breath sounds normal. No stridor. No respiratory distress. He has no wheezes. He has no rales.  Lungs CTA bilaterally.  Abdominal: Soft. He exhibits no distension. There is no tenderness. There is no rebound and no guarding.  Hyperactive bowel sounds. Constipated clinically.   Musculoskeletal: Normal range  of motion. He exhibits no edema or tenderness.  All compartments are soft. No palpable cords.   Lymphadenopathy:    He has no cervical adenopathy.  Neurological: He is alert and oriented to person, place, and time. He has normal reflexes. He displays normal reflexes.  Skin: Skin is warm and dry. Capillary refill takes less than 2 seconds.  Cellulitis that's starts on mid foot at toes 3,4,5, dorsal of lateral foot up the ankle to the mid shin. Ulcer with necrosis on the plantar aspect of the 2nd toe. Athlete's foot on all of the toes.   Psychiatric: He has a normal mood and affect. His behavior is normal.  Nursing note and vitals reviewed.    ED Treatments / Results   Vitals:   09/16/16 0130 09/16/16 0200  BP: 116/63 126/73  Pulse: 71 72  Resp: 15 (!) 21  Temp:      DIAGNOSTIC STUDIES: Oxygen Saturation is 93% on RA, adequate by my interpretation.   COORDINATION OF CARE: 11:27 PM-Discussed next steps with pt including IV antibiotics and admission. Pt verbalized understanding and is agreeable with the plan.   Labs (all labs ordered  are listed, but only abnormal results are displayed)  Results for orders placed or performed during the hospital encounter of 09/15/16  Comprehensive metabolic panel  Result Value Ref Range   Sodium 129 (L) 135 - 145 mmol/L   Potassium 4.6 3.5 - 5.1 mmol/L   Chloride 96 (L) 101 - 111 mmol/L   CO2 22 22 - 32 mmol/L   Glucose, Bld 156 (H) 65 - 99 mg/dL   BUN 20 6 - 20 mg/dL   Creatinine, Ser 1.04 0.61 - 1.24 mg/dL   Calcium 8.6 (L) 8.9 - 10.3 mg/dL   Total Protein 8.5 (H) 6.5 - 8.1 g/dL   Albumin 3.8 3.5 - 5.0 g/dL   AST 30 15 - 41 U/L   ALT 32 17 - 63 U/L   Alkaline Phosphatase 72 38 - 126 U/L   Total Bilirubin 0.6 0.3 - 1.2 mg/dL   GFR calc non Af Amer >60 >60 mL/min   GFR calc Af Amer >60 >60 mL/min   Anion gap 11 5 - 15  CBC with Differential  Result Value Ref Range   WBC 7.5 4.0 - 10.5 K/uL   RBC 4.88 4.22 - 5.81 MIL/uL    Hemoglobin 13.3 13.0 - 17.0 g/dL   HCT 38.3 (L) 39.0 - 52.0 %   MCV 78.5 78.0 - 100.0 fL   MCH 27.3 26.0 - 34.0 pg   MCHC 34.7 30.0 - 36.0 g/dL   RDW 14.5 11.5 - 15.5 %   Platelets 155 150 - 400 K/uL   Neutrophils Relative % 74 %   Neutro Abs 5.6 1.7 - 7.7 K/uL   Lymphocytes Relative 15 %   Lymphs Abs 1.1 0.7 - 4.0 K/uL   Monocytes Relative 10 %   Monocytes Absolute 0.7 0.1 - 1.0 K/uL   Eosinophils Relative 1 %   Eosinophils Absolute 0.1 0.0 - 0.7 K/uL   Basophils Relative 0 %   Basophils Absolute 0.0 0.0 - 0.1 K/uL  Urinalysis, Routine w reflex microscopic  Result Value Ref Range   Color, Urine YELLOW YELLOW   APPearance CLEAR CLEAR   Specific Gravity, Urine 1.023 1.005 - 1.030   pH 5.0 5.0 - 8.0   Glucose, UA NEGATIVE NEGATIVE mg/dL   Hgb urine dipstick SMALL (A) NEGATIVE   Bilirubin Urine NEGATIVE NEGATIVE   Ketones, ur NEGATIVE NEGATIVE mg/dL   Protein, ur 100 (A) NEGATIVE mg/dL   Nitrite NEGATIVE NEGATIVE   Leukocytes, UA NEGATIVE NEGATIVE   RBC / HPF 0-5 0 - 5 RBC/hpf   WBC, UA 0-5 0 - 5 WBC/hpf   Bacteria, UA NONE SEEN NONE SEEN   Squamous Epithelial / LPF 0-5 (A) NONE SEEN   Mucous PRESENT   I-Stat CG4 Lactic Acid, ED  Result Value Ref Range   Lactic Acid, Venous 1.21 0.5 - 1.9 mmol/L  I-Stat CG4 Lactic Acid, ED  Result Value Ref Range   Lactic Acid, Venous 1.45 0.5 - 1.9 mmol/L   Dg Chest 2 View  Result Date: 09/15/2016 CLINICAL DATA:  66 year old male with fever. EXAM: CHEST  2 VIEW COMPARISON:  Chest radiograph dated 02/05/2016 FINDINGS: Minimal left lung base atelectatic changes. Infiltrate is not excluded. No focal consolidation, pleural effusion, or pneumothorax. Top-normal cardiac size. No acute osseous pathology. IMPRESSION: Left lung base subsegmental atelectasis. Infiltrate is less likely. Clinical correlation is recommended. Electronically Signed   By: Anner Crete M.D.   On: 09/15/2016 23:39    Radiology Dg Chest 2 View  Result Date:  09/15/2016 CLINICAL DATA:  66 year old male with fever. EXAM: CHEST  2 VIEW COMPARISON:  Chest radiograph dated 02/05/2016 FINDINGS: Minimal left lung base atelectatic changes. Infiltrate is not excluded. No focal consolidation, pleural effusion, or pneumothorax. Top-normal cardiac size. No acute osseous pathology. IMPRESSION: Left lung base subsegmental atelectasis. Infiltrate is less likely. Clinical correlation is recommended. Electronically Signed   By: Anner Crete M.D.   On: 09/15/2016 23:39    Procedures Procedures (including critical care time)  Medications Ordered in ED Medications  sodium chloride 0.9 % bolus 1,000 mL (1,000 mLs Intravenous New Bag/Given 09/16/16 0038)    And  sodium chloride 0.9 % bolus 1,000 mL (1,000 mLs Intravenous New Bag/Given 09/16/16 0037)    And  sodium chloride 0.9 % bolus 1,000 mL (0 mLs Intravenous Stopped 09/16/16 0030)    And  sodium chloride 0.9 % bolus 1,000 mL (0 mLs Intravenous Stopped 09/16/16 0030)  vancomycin (VANCOCIN) 2,500 mg in sodium chloride 0.9 % 500 mL IVPB (2,500 mg Intravenous New Bag/Given 09/16/16 0001)  piperacillin-tazobactam (ZOSYN) IVPB 3.375 g (not administered)  vancomycin (VANCOCIN) IVPB 1000 mg/200 mL premix (not administered)  acetaminophen (TYLENOL) tablet 650 mg (650 mg Oral Given 09/15/16 2147)  piperacillin-tazobactam (ZOSYN) IVPB 3.375 g (0 g Intravenous Stopped 09/16/16 0021)        Final Clinical Impressions(s) / ED Diagnoses  Cellulitis failure of outpatient antibiotics will admit to medicine  I personally performed the services described in this documentation, which was scribed in my presence. The recorded information has been reviewed and is accurate.      Nadean Montanaro, MD 09/16/16 [phone]

## 2016-09-15 NOTE — ED Triage Notes (Signed)
Pt from home with swelling and redness to his right foot and lower leg that began around 2200 on Wed 7/9. Pt states he saw his PCP who initially treated it as gout. When his symptoms got worse, he revisited his PCP who put him on 2 antibiotics (augmentin and septra) yesterday. Today pt is febrile, tachycardic, and tachypnic.

## 2016-09-15 NOTE — Progress Notes (Signed)
Pharmacy Antibiotic Note  Glenn Herrera is a 66 y.o. male with worsening swelling and redness to his right food and lower leg admitted on 09/15/2016 with sepsis.  Pharmacy has been consulted for zosyn and vancomycin dosing.  Plan: Zosyn 3.375g IV q8h (4 hour infusion).  Vancomycin 2500 mg x 1 then 1 Gm IV q12h VT=15-20 mg/L Daily Scr F/u cultures/levels as indicated  Height: 5\' 9"  (175.3 cm) Weight: 269 lb (122 kg) IBW/kg (Calculated) : 70.7  Temp (24hrs), Avg:102.2 F (39 C), Min:102.2 F (39 C), Max:102.2 F (39 C)   Recent Labs Lab 09/15/16 2148 09/15/16 2159  WBC 7.5  --   CREATININE 1.04  --   LATICACIDVEN  --  1.21    Estimated Creatinine Clearance: 93.8 mL/min (by C-G formula based on SCr of 1.04 mg/dL).    No Known Allergies  Antimicrobials this admission: 7/10 zosyn >>  7/11 vancomycin >>   Dose adjustments this admission:   Microbiology results:  BCx:   UCx:    Sputum:    MRSA PCR:   Thank you for allowing pharmacy to be a part of this patient's care.  Dorrene German 09/15/2016 11:37 PM

## 2016-09-16 ENCOUNTER — Encounter (HOSPITAL_COMMUNITY): Payer: Self-pay | Admitting: Internal Medicine

## 2016-09-16 DIAGNOSIS — E871 Hypo-osmolality and hyponatremia: Secondary | ICD-10-CM | POA: Diagnosis present

## 2016-09-16 DIAGNOSIS — L03115 Cellulitis of right lower limb: Secondary | ICD-10-CM | POA: Diagnosis present

## 2016-09-16 DIAGNOSIS — A419 Sepsis, unspecified organism: Secondary | ICD-10-CM | POA: Diagnosis present

## 2016-09-16 DIAGNOSIS — Z7984 Long term (current) use of oral hypoglycemic drugs: Secondary | ICD-10-CM | POA: Diagnosis not present

## 2016-09-16 DIAGNOSIS — Z8249 Family history of ischemic heart disease and other diseases of the circulatory system: Secondary | ICD-10-CM | POA: Diagnosis not present

## 2016-09-16 DIAGNOSIS — Z8546 Personal history of malignant neoplasm of prostate: Secondary | ICD-10-CM | POA: Diagnosis not present

## 2016-09-16 DIAGNOSIS — K5900 Constipation, unspecified: Secondary | ICD-10-CM | POA: Diagnosis not present

## 2016-09-16 DIAGNOSIS — R6510 Systemic inflammatory response syndrome (SIRS) of non-infectious origin without acute organ dysfunction: Secondary | ICD-10-CM | POA: Diagnosis not present

## 2016-09-16 DIAGNOSIS — Z6839 Body mass index (BMI) 39.0-39.9, adult: Secondary | ICD-10-CM | POA: Diagnosis not present

## 2016-09-16 DIAGNOSIS — G4733 Obstructive sleep apnea (adult) (pediatric): Secondary | ICD-10-CM | POA: Diagnosis present

## 2016-09-16 DIAGNOSIS — Z833 Family history of diabetes mellitus: Secondary | ICD-10-CM | POA: Diagnosis not present

## 2016-09-16 DIAGNOSIS — E785 Hyperlipidemia, unspecified: Secondary | ICD-10-CM | POA: Diagnosis present

## 2016-09-16 DIAGNOSIS — E119 Type 2 diabetes mellitus without complications: Secondary | ICD-10-CM | POA: Diagnosis not present

## 2016-09-16 DIAGNOSIS — Z79899 Other long term (current) drug therapy: Secondary | ICD-10-CM | POA: Diagnosis not present

## 2016-09-16 DIAGNOSIS — E1142 Type 2 diabetes mellitus with diabetic polyneuropathy: Secondary | ICD-10-CM | POA: Diagnosis present

## 2016-09-16 DIAGNOSIS — I10 Essential (primary) hypertension: Secondary | ICD-10-CM | POA: Diagnosis present

## 2016-09-16 DIAGNOSIS — I159 Secondary hypertension, unspecified: Secondary | ICD-10-CM | POA: Diagnosis not present

## 2016-09-16 DIAGNOSIS — E11610 Type 2 diabetes mellitus with diabetic neuropathic arthropathy: Secondary | ICD-10-CM | POA: Diagnosis present

## 2016-09-16 DIAGNOSIS — M109 Gout, unspecified: Secondary | ICD-10-CM | POA: Diagnosis present

## 2016-09-16 DIAGNOSIS — E669 Obesity, unspecified: Secondary | ICD-10-CM | POA: Diagnosis present

## 2016-09-16 DIAGNOSIS — M609 Myositis, unspecified: Secondary | ICD-10-CM | POA: Diagnosis present

## 2016-09-16 DIAGNOSIS — M7989 Other specified soft tissue disorders: Secondary | ICD-10-CM | POA: Diagnosis not present

## 2016-09-16 LAB — CBC
HCT: 39.9 % (ref 39.0–52.0)
Hemoglobin: 13.6 g/dL (ref 13.0–17.0)
MCH: 27.6 pg (ref 26.0–34.0)
MCHC: 34.1 g/dL (ref 30.0–36.0)
MCV: 81.1 fL (ref 78.0–100.0)
PLATELETS: ADEQUATE 10*3/uL (ref 150–400)
RBC: 4.92 MIL/uL (ref 4.22–5.81)
RDW: 15 % (ref 11.5–15.5)
WBC: 5.9 10*3/uL (ref 4.0–10.5)

## 2016-09-16 LAB — GLUCOSE, CAPILLARY
GLUCOSE-CAPILLARY: 155 mg/dL — AB (ref 65–99)
GLUCOSE-CAPILLARY: 236 mg/dL — AB (ref 65–99)
Glucose-Capillary: 162 mg/dL — ABNORMAL HIGH (ref 65–99)
Glucose-Capillary: 137 mg/dL — ABNORMAL HIGH (ref 65–99)

## 2016-09-16 LAB — SODIUM, URINE, RANDOM: Sodium, Ur: 76 mmol/L

## 2016-09-16 LAB — BASIC METABOLIC PANEL
Anion gap: 9 (ref 5–15)
BUN: 19 mg/dL (ref 6–20)
CALCIUM: 8.2 mg/dL — AB (ref 8.9–10.3)
CO2: 24 mmol/L (ref 22–32)
CREATININE: 1.13 mg/dL (ref 0.61–1.24)
Chloride: 102 mmol/L (ref 101–111)
Glucose, Bld: 140 mg/dL — ABNORMAL HIGH (ref 65–99)
Potassium: 4.4 mmol/L (ref 3.5–5.1)
SODIUM: 135 mmol/L (ref 135–145)

## 2016-09-16 LAB — URINALYSIS, ROUTINE W REFLEX MICROSCOPIC
BILIRUBIN URINE: NEGATIVE
Bacteria, UA: NONE SEEN
GLUCOSE, UA: NEGATIVE mg/dL
Ketones, ur: NEGATIVE mg/dL
LEUKOCYTES UA: NEGATIVE
NITRITE: NEGATIVE
PH: 5 (ref 5.0–8.0)
Protein, ur: 100 mg/dL — AB
SPECIFIC GRAVITY, URINE: 1.023 (ref 1.005–1.030)

## 2016-09-16 LAB — I-STAT CG4 LACTIC ACID, ED: Lactic Acid, Venous: 1.45 mmol/L (ref 0.5–1.9)

## 2016-09-16 LAB — HIV ANTIBODY (ROUTINE TESTING W REFLEX): HIV SCREEN 4TH GENERATION: NONREACTIVE

## 2016-09-16 LAB — TSH: TSH: 0.762 u[IU]/mL (ref 0.350–4.500)

## 2016-09-16 LAB — PROCALCITONIN: Procalcitonin: 0.26 ng/mL

## 2016-09-16 LAB — OSMOLALITY: Osmolality: 291 mOsm/kg (ref 275–295)

## 2016-09-16 LAB — LACTIC ACID, PLASMA: LACTIC ACID, VENOUS: 1.1 mmol/L (ref 0.5–1.9)

## 2016-09-16 LAB — C-REACTIVE PROTEIN: CRP: 22.5 mg/dL — AB (ref <1.0)

## 2016-09-16 LAB — SEDIMENTATION RATE: SED RATE: 100 mm/h — AB (ref 0–16)

## 2016-09-16 LAB — OSMOLALITY, URINE: Osmolality, Ur: 744 mOsm/kg (ref 300–900)

## 2016-09-16 MED ORDER — BENAZEPRIL HCL 20 MG PO TABS
20.0000 mg | ORAL_TABLET | Freq: Every day | ORAL | Status: DC
  Administered 2016-09-16 – 2016-09-20 (×5): 20 mg via ORAL
  Filled 2016-09-16 (×5): qty 1

## 2016-09-16 MED ORDER — ALLOPURINOL 300 MG PO TABS
300.0000 mg | ORAL_TABLET | Freq: Every day | ORAL | Status: DC
  Administered 2016-09-16 – 2016-09-19 (×4): 300 mg via ORAL
  Filled 2016-09-16 (×4): qty 1

## 2016-09-16 MED ORDER — POLYETHYLENE GLYCOL 3350 17 G PO PACK
17.0000 g | PACK | Freq: Every day | ORAL | Status: DC
  Administered 2016-09-16 – 2016-09-20 (×5): 17 g via ORAL
  Filled 2016-09-16 (×5): qty 1

## 2016-09-16 MED ORDER — HYDROCODONE-ACETAMINOPHEN 5-325 MG PO TABS
2.0000 | ORAL_TABLET | Freq: Four times a day (QID) | ORAL | Status: DC | PRN
  Administered 2016-09-16 – 2016-09-20 (×10): 2 via ORAL
  Filled 2016-09-16 (×10): qty 2

## 2016-09-16 MED ORDER — INSULIN ASPART 100 UNIT/ML ~~LOC~~ SOLN
0.0000 [IU] | Freq: Three times a day (TID) | SUBCUTANEOUS | Status: DC
  Administered 2016-09-16: 2 [IU] via SUBCUTANEOUS
  Administered 2016-09-16: 3 [IU] via SUBCUTANEOUS
  Administered 2016-09-16: 2 [IU] via SUBCUTANEOUS
  Administered 2016-09-17 (×2): 1 [IU] via SUBCUTANEOUS
  Administered 2016-09-17: 2 [IU] via SUBCUTANEOUS
  Administered 2016-09-18: 1 [IU] via SUBCUTANEOUS
  Administered 2016-09-18: 2 [IU] via SUBCUTANEOUS
  Administered 2016-09-18: 1 [IU] via SUBCUTANEOUS
  Administered 2016-09-19 (×2): 2 [IU] via SUBCUTANEOUS
  Administered 2016-09-19: 3 [IU] via SUBCUTANEOUS
  Administered 2016-09-20 (×2): 2 [IU] via SUBCUTANEOUS

## 2016-09-16 MED ORDER — PIPERACILLIN-TAZOBACTAM 3.375 G IVPB
3.3750 g | Freq: Three times a day (TID) | INTRAVENOUS | Status: DC
  Administered 2016-09-16 – 2016-09-20 (×13): 3.375 g via INTRAVENOUS
  Filled 2016-09-16 (×14): qty 50

## 2016-09-16 MED ORDER — SODIUM CHLORIDE 0.9% FLUSH
3.0000 mL | Freq: Two times a day (BID) | INTRAVENOUS | Status: DC
  Administered 2016-09-16 – 2016-09-20 (×8): 3 mL via INTRAVENOUS

## 2016-09-16 MED ORDER — ACETAMINOPHEN 325 MG PO TABS: 650.0000 mg | ORAL_TABLET | ORAL | Status: DC | PRN

## 2016-09-16 MED ORDER — ENOXAPARIN SODIUM 40 MG/0.4ML ~~LOC~~ SOLN: 40.0000 mg | SUBCUTANEOUS | Status: DC

## 2016-09-16 MED ORDER — ENOXAPARIN SODIUM 60 MG/0.6ML ~~LOC~~ SOLN
60.0000 mg | Freq: Every day | SUBCUTANEOUS | Status: DC
  Administered 2016-09-16 – 2016-09-20 (×5): 60 mg via SUBCUTANEOUS
  Filled 2016-09-16 (×5): qty 0.6

## 2016-09-16 MED ORDER — ONDANSETRON HCL 4 MG/2ML IJ SOLN: 4.0000 mg | Freq: Three times a day (TID) | INTRAMUSCULAR | Status: DC | PRN

## 2016-09-16 MED ORDER — INSULIN ASPART 100 UNIT/ML ~~LOC~~ SOLN: 0.0000 [IU] | Freq: Every day | SUBCUTANEOUS | Status: DC

## 2016-09-16 MED ORDER — VANCOMYCIN HCL IN DEXTROSE 1-5 GM/200ML-% IV SOLN
1000.0000 mg | Freq: Two times a day (BID) | INTRAVENOUS | Status: DC
  Administered 2016-09-16 – 2016-09-20 (×9): 1000 mg via INTRAVENOUS
  Filled 2016-09-16 (×9): qty 200

## 2016-09-16 MED ORDER — ASPIRIN 325 MG PO TABS
325.0000 mg | ORAL_TABLET | Freq: Every day | ORAL | Status: DC
  Administered 2016-09-16 – 2016-09-19 (×4): 325 mg via ORAL
  Filled 2016-09-16 (×4): qty 1

## 2016-09-16 MED ORDER — ZOLPIDEM TARTRATE 5 MG PO TABS: 5.0000 mg | ORAL_TABLET | Freq: Every evening | ORAL | Status: DC | PRN

## 2016-09-16 MED ORDER — SIMVASTATIN 20 MG PO TABS
20.0000 mg | ORAL_TABLET | Freq: Every day | ORAL | Status: DC
  Administered 2016-09-16 – 2016-09-19 (×4): 20 mg via ORAL
  Filled 2016-09-16 (×3): qty 2
  Filled 2016-09-16 (×4): qty 1
  Filled 2016-09-16: qty 2

## 2016-09-16 MED ORDER — HYDRALAZINE HCL 20 MG/ML IJ SOLN: 5.0000 mg | INTRAMUSCULAR | Status: DC | PRN

## 2016-09-16 NOTE — Progress Notes (Signed)
This shift pt arrived to unit room 1507 via stretcher with spouse at the bedside. Alert and oriented x3. VS taken. Pt oriented to room and callbell with no complications. Pain 10/10. Pt guide at the bedside. Initial assessment completed. Will continue to monitor

## 2016-09-16 NOTE — H&P (Signed)
History and Physical    Glenn Herrera HEN:[id number] DOB: November 15, 1950 DOA: 09/15/2016  Referring MD/NP/PA:   PCP: Gaynelle Arabian, MD   Patient coming from:  The patient is coming from home.  At baseline, pt is independent for most of ADL.   Chief Complaint: Right lower leg and foot pain, swelling  HPI: [name] is a 66 y.o. male with medical history significant of hypertension, hyperlipidemia, diabetes mellitus, gout, prostate cancer, who presents with right lower leg and foot pain in his swelling.  Patient states that he has been having right lower leg and foot pain and swelling for almost 6 days. His R lower leg and foot are also erythematous. The pain is constant, 6 out of 10 in severity, sharp, nonradiating. Patient also has fever and chills. Patient was seen by PCP and initially treated for gout without improvement. Then he was started with 2 antibiotics, Augmentin and Septra yesterday, still no any help. Patient has worsening pain. Patient does not have chest pain, shortness of breath, cough, nausea, vomiting, diarrhea, symptoms of UTI or unilateral weakness.  ED Course: pt was found to have WBC 7.5, lactic acid 1.21, sodium 129, creatinine 1.04, negative urinalysis, temperature 102.2, tachycardia, tachypnea, oxygen saturation normal. Patient is admitted to telemetry bed as inpatient.  Review of Systems:   General: has fevers, chills, no changes in body weight, has fatigue HEENT: no blurry vision, hearing changes or sore throat Respiratory: no dyspnea, coughing, wheezing CV: no chest pain, no palpitations GI: no nausea, vomiting, abdominal pain, diarrhea, constipation GU: no dysuria, burning on urination, increased urinary frequency, hematuria  Ext: has R leg and foot edema Neuro: no unilateral weakness, numbness, or tingling, no vision change or hearing loss Skin: no rash, no skin tear. MSK: No muscle spasm, no deformity, no limitation of range of movement in spin Heme: No  easy bruising.  Travel history: No recent long distant travel.  Allergy: No Known Allergies  Past Medical History:  Diagnosis Date  . Cancer [facility])    prostate   . Diabetes mellitus without complication ([place])   . High blood cholesterol   . HLD (hyperlipidemia)   . Hypertension     Past Surgical History:  Procedure Laterality Date  . LAPAROSCOPIC APPENDECTOMY N/A 02/06/2016   Procedure: APPENDECTOMY LAPAROSCOPIC;  Surgeon: Excell Seltzer, MD;  Location: [facility] ORS;  Service: General;  Laterality: N/A;  . PROSTATE SURGERY      Social History:  reports that he has never smoked. He has never used smokeless tobacco. He reports that he does not drink alcohol or use drugs.  Family History:  Family History  Problem Relation Age of Onset  . Diabetes Mellitus II Mother   . Gout Mother   . Hypertension Father   . Diabetes Mellitus II Brother      Prior to Admission medications   Medication Sig Start Date End Date Taking? Authorizing Provider  allopurinol (ZYLOPRIM) 300 MG tablet Take 300 mg by mouth at bedtime.   Yes [provider]  amoxicillin-clavulanate (AUGMENTIN) 875-125 MG tablet Take 1 tablet by mouth 2 (two) times daily.   Yes [provider]  aspirin 325 MG tablet Take 325 mg by mouth at bedtime.   Yes [provider]  benazepril-hydrochlorthiazide (LOTENSIN HCT) 20-12.5 MG per tablet Take 1 tablet by mouth at bedtime.   Yes [provider]  glimepiride (AMARYL) 2 MG tablet Take 2 mg by mouth every evening.   Yes [provider]  HYDROcodone-acetaminophen (  NORCO/VICODIN) 5-325 MG tablet Take 2 tablets by mouth every 6 (six) hours as needed for moderate pain or severe pain.   Yes [provider]  metFORMIN (GLUCOPHAGE-XR) 500 MG 24 hr tablet Take 2 tablets (1,000 mg total) by mouth 2 times daily at 12 noon and 4 pm. Resume as before and restart at bedtime tonight, 02/07/16. 05/23/16  Yes Robyn Haber, MD  simvastatin  (ZOCOR) 20 MG tablet Take 20 mg by mouth at bedtime.   Yes [provider]  sulfamethoxazole-trimethoprim (BACTRIM DS,SEPTRA DS) 800-160 MG tablet Take 1 tablet by mouth 2 (two) times daily.   Yes [provider]  acetaminophen (TYLENOL) 325 MG tablet Take 2 tablets (650 mg total) by mouth every 4 (four) hours as needed. Patient not taking: Reported on 09/15/2016 02/07/16 02/06/17  Earnstine Regal, PA-C  ciprofloxacin (CIPRO) 500 MG tablet Take 1 tablet (500 mg total) by mouth 2 (two) times daily. Patient not taking: Reported on 09/15/2016 05/23/16   Robyn Haber, MD    Physical Exam: Vitals:   09/16/16 0400 09/16/16 0430 09/16/16 0500 09/16/16 0559  BP: 124/63 (!) 164/84 139/67 134/63  Pulse: 68 65 68 67  Resp: _0 Temp:   98.4 F (36.9 C) 99.1 F (37.3 C)  TempSrc:   Oral Oral  SpO2: 99% 92% 99% 100%  Weight:    122.4 kg (269 lb 14.4 oz)  Height:       General: Not in acute distress HEENT:       Eyes: PERRL, EOMI, no scleral icterus.       ENT: No discharge from the ears and nose, no pharynx injection, no tonsillar enlargement.        Neck: No JVD, no bruit, no mass felt. Heme: No neck lymph node enlargement. Cardiac: S1/S2, RRR, No murmurs, No gallops or rubs. Respiratory: No rales, wheezing, rhonchi or rubs. GI: Soft, nondistended, nontender, no rebound pain, no organomegaly, BS present. GU: No hematuria Ext: 2+DP/PT pulse bilaterally. Musculoskeletal: No joint deformities, No joint redness or warmth, no limitation of ROM in spin. Skin: R lower leg and foot are swollen, erythematous, tender and warm. Neuro: Alert, oriented X3, cranial nerves II-XII grossly intact, moves all extremities normally.  Psych: Patient is not psychotic, no suicidal or hemocidal ideation.  Labs on Admission: I have personally reviewed following labs and imaging studies  CBC:  Recent Labs Lab 09/15/16 2148 09/16/16 0524  WBC 7.5 5.9  NEUTROABS 5.6  --   HGB 13.3  13.6  HCT 38.3* 39.9  MCV 78.5 81.1  PLT 155 PENDING   Basic Metabolic Panel:  Recent Labs Lab 09/15/16 2148 09/16/16 0524  NA 129* 135  K 4.6 4.4  CL 96* 102  CO2 22 24  GLUCOSE 156* 140*  BUN 20 19  CREATININE 1.04 1.13  CALCIUM 8.6* 8.2*   GFR: Estimated Creatinine Clearance: 86.5 mL/min (by C-G formula based on SCr of 1.13 mg/dL). Liver Function Tests:  Recent Labs Lab 09/15/16 2148  AST 30  ALT 32  ALKPHOS 72  BILITOT 0.6  PROT 8.5*  ALBUMIN 3.8   No results for input(s): LIPASE, AMYLASE in the last 168 hours. No results for input(s): AMMONIA in the last 168 hours. Coagulation Profile: No results for input(s): INR, PROTIME in the last 168 hours. Cardiac Enzymes: No results for input(s): CKTOTAL, CKMB, CKMBINDEX, TROPONINI in the last 168 hours. BNP (last 3 results) No results for input(s): PROBNP in the last 8760 hours. HbA1C: No  results for input(s): HGBA1C in the last 72 hours. CBG: No results for input(s): GLUCAP in the last 168 hours. Lipid Profile: No results for input(s): CHOL, HDL, LDLCALC, TRIG, CHOLHDL, LDLDIRECT in the last 72 hours. Thyroid Function Tests: No results for input(s): TSH, T4TOTAL, FREET4, T3FREE, THYROIDAB in the last 72 hours. Anemia Panel: No results for input(s): VITAMINB12, FOLATE, FERRITIN, TIBC, IRON, RETICCTPCT in the last 72 hours. Urine analysis:    Component Value Date/Time   COLORURINE YELLOW 09/15/2016 2357   APPEARANCEUR CLEAR 09/15/2016 2357   LABSPEC 1.023 09/15/2016 2357   PHURINE 5.0 09/15/2016 2357   GLUCOSEU NEGATIVE 09/15/2016 2357   HGBUR SMALL (A) 09/15/2016 2357   BILIRUBINUR NEGATIVE 09/15/2016 2357   [place] 09/15/2016 2357   PROTEINUR 100 (A) 09/15/2016 2357   UROBILINOGEN >=8.0 05/23/2016 1714   NITRITE NEGATIVE 09/15/2016 2357   LEUKOCYTESUR NEGATIVE 09/15/2016 2357   Sepsis Labs: _0 (procalcitonin:4,lacticidven:4) )No results found for this or any previous visit (from the  past 240 hour(s)).   Radiological Exams on Admission: Dg Chest 2 View  Result Date: 09/15/2016 CLINICAL DATA:  66 year old male with fever. EXAM: CHEST  2 VIEW COMPARISON:  Chest radiograph dated 02/05/2016 FINDINGS: Minimal left lung base atelectatic changes. Infiltrate is not excluded. No focal consolidation, pleural effusion, or pneumothorax. Top-normal cardiac size. No acute osseous pathology. IMPRESSION: Left lung base subsegmental atelectasis. Infiltrate is less likely. Clinical correlation is recommended. Electronically Signed   By: Anner Crete M.D.   On: 09/15/2016 23:39     EKG: Independently reviewed.  Sinus rhythm, QTC 423, low voltage, T-wave inversion only in lead 3  Assessment/Plan Principal Problem:   Cellulitis of right lower extremity Active Problems:   Hypertension   Diabetes mellitus without complication (HCC)   HLD (hyperlipidemia)   Cellulitis of right foot   Sepsis (HCC)   Hyponatremia   Cellulitis of right lower extremity and sepsis: Patient admitted critically for sepsis with fever, tachycardia and tachypnea. Lactic acid is normal. Currently hemodynamically stable. Pt failed outpt oral Abx treatment.  - will admit to tele bed as inpt - Empiric antimicrobial treatment with vancomycin and Zosyn per pharmacy - PRN Zofran for nausea, Norco for pain - Blood cultures x 2  - ESR and CRP - will get Procalcitonin and trend lactic acid levels per sepsis protocol. - IVF: 4L of NS bolus in ED, followed by 75 cc/h - LE doppler to r/o DVT  Hyponatremia: Most likely due to the use of HCTZ-Lotensin.  - will hold HCTZ-Lotensin - Will check urine sodium, urine osmolality, serum osmolality. - check TSH - IVF: as above - f/u by BMP  HTN: bp 116/63 -hold Loteneisn-HCTZ due to hyponatremia -IV hydralazine when necessary  DM-II: Last A1c not on record. Patient is taking metformin and Amaryl at home -SSI  HLD: -zocor   DVT ppx: sQ Lovenox Code Status: Full  code Family Communication: Yes, patient's  Wife at bed side Disposition Plan:  Anticipate discharge back to previous home environment Consults called:  none Admission status: Inpatient/tele     Date of Service 09/16/2016    Ivor Costa [facility] Pager [phone]  If 7PM-7AM, please contact night-coverage [email or web address] Password [facility] 09/16/2016, 6:17 AM

## 2016-09-16 NOTE — Progress Notes (Signed)
Rx Brief note:  Lovenox  Wt 122 kg, BMI=39, CrCl ~85 ml/min  Rx adjusted Lovenox to 60 mg daily for DVT prophylaxis in pt with BMI>30  Thanks Dorrene German 09/16/2016 6:12 AM

## 2016-09-16 NOTE — Progress Notes (Signed)
Progress Note    Glenn Herrera  UUE:[id number] DOB: June 03, 1950  DOA: 09/15/2016 PCP: Gaynelle Arabian, MD    Brief Narrative:   Chief complaint: Follow-up right lower extremity pain/swelling secondary cellulitis  Medical records reviewed and are as summarized below:  Glenn Herrera is an 66 y.o. male with a PMH of hypertension, hyperlipidemia, diabetes, gout, and prostate cancer who was admitted 09/15/16 with a chief complaint of a six-day history of right lower leg and foot pain and swelling associated with fever and chills. He has failed outpatient therapy with Augmentin and Septra for cellulitis.  Assessment/Plan:   Principal Problem:   Sepsis secondary to Cellulitis of right lower extremity Failed outpatient therapy with appropriate antibiotics. Febrile. Blood cultures obtained. Lower extremity Dopplers ordered to rule out DVT. Continue empiric vancomycin and Zosyn for now.Monitor renal function closely while on vancomycin secondary to potential for nephrotoxicity.  Active Problems:   Hypertension HCTZ/Lotensin held on admission. The pressure beginning to rise. Resume Lotensin.    Diabetes mellitus without complication ([place]) Currently being managed with insulin sensitive SSI. Monitor and adjust therapy as needed.    HLD (hyperlipidemia) Continue Zocor.    Hyponatremia HCTZ on hold. Sodium up to 135.  HIV screening The patient falls between the ages of 13-64 and should be screened for HIV, therefore HIV testing ordered.    Obesity Body mass index is 39.86 kg/m.   Family Communication/Anticipated D/C date and plan/Code Status   DVT prophylaxis: Lovenox ordered. Code Status: Full Code.  Family Communication: No family at bedside. Disposition Plan: Home in 24-48 hours if cellulitis show signs of regression.   Medical Consultants:    None.   Anti-Infectives:    Vancomycin 09/15/16--->  Zosyn 09/15/16--->  Subjective:   Patient complains of right lower  extremity swelling and pain that persists. Appetite is okay. No nausea, or vomiting. Low-grade fever but no chills.  Objective:    Vitals:   09/16/16 0400 09/16/16 0430 09/16/16 0500 09/16/16 0559  BP: 124/63 (!) 164/84 139/67 134/63  Pulse: 68 65 68 67  Resp: '17 17 20 20  ' Temp:   98.4 F (36.9 C) 99.1 F (37.3 C)  TempSrc:   Oral Oral  SpO2: 99% 92% 99% 100%  Weight:    122.4 kg (269 lb 14.4 oz)  Height:        Intake/Output Summary (Last 24 hours) at 09/16/16 0758 Last data filed at 09/16/16 0726  Gross per 24 hour  Intake              120 ml  Output              650 ml  Net             -530 ml   Filed Weights   09/15/16 2128 09/16/16 0559  Weight: 122 kg (269 lb) 122.4 kg (269 lb 14.4 oz)    Exam: General exam: Obese male in no distress.  Edentulous. Respiratory system: Lungs CTAB Cardiovascular system: Heart RRR Gastrointestinal system: Abdomen is obese with a midline hernia above umbilicus Central nervous system: Alert and oriented 3. Nonfocal. Extremities: RLE edematous, red, swollen. Skin: Petechiae on all 4 extremities, worse on left arm.  RLE w/ erythema and swelling, scabbed area on 2nd toe where he says he stepped on a pin that was in his shoe. Psychiatry: Mood and affect normal.        Data Reviewed:   I have personally reviewed following labs and  imaging studies:  Labs: Labs show the following: Chemistries normal with the exception of mildly elevated glucose at 140 and an elevated total protein of 8.5. LFTs normal. CRP elevated at 22.5. ESR elevated 100. TSH 0.762. Lactic acid 1.1. Pro calcitonin 0.26. HIV nonreactive. Serum osmolality 291. Urine sodium 76. Urine osmolality 744.  Blood cultures pending.   Procedures and diagnostic studies:  Dg Chest 2 View 09/15/2016: Left lung base subsegmental atelectasis.    Medications:   . allopurinol  300 mg Oral QHS  . aspirin  325 mg Oral QHS  . enoxaparin (LOVENOX) injection  60 mg Subcutaneous  Daily  . insulin aspart  0-5 Units Subcutaneous QHS  . insulin aspart  0-9 Units Subcutaneous TID WC  . simvastatin  20 mg Oral QHS  . sodium chloride flush  3 mL Intravenous Q12H   Continuous Infusions: . piperacillin-tazobactam (ZOSYN)  IV 3.375 g (09/16/16 0745)  . vancomycin      Medical decision making is of high complexity and this patient is at high risk of deterioration, therefore this is a level 3 visit.  (> 4 problem points, >4 data points, high risk: Need 2 out of 3)   Problems/DDx Points   Self limited or minor (max 2)       1  1  Established problem, stable       1  3  Established problem, worsening       2   New problem, no additional W/U planned (max 1)       3  3  New problem, additional W/U planned        4    Data Reviewed Points   Review/order clinical lab tests       1   1  Review/order x-rays       1   Review/order tests (Echo, EKG, PFTs, etc)       1   Discussion of test results w/ performing MD       1   Independent review of image, tracing or specimen       2  2  Decision to obtain old records       1   Review and summation of old records       2   2   Level of risk Presenting prob Diagnostics Management   Minimal 1 self limited/minor Labs CXR EKG/EEG U/A U/S Rest Gargles Bandages Dressings   Low 2 or more self limited/minor 1 stable chronic Acute uncomplicated illness Tests (PFTS) Non-CV imaging Arterial labs Biopsies of skin OTC drugs Minor surgery-no risk PT OT IVF without additives    Moderate 1 or more chronic illnesses w/ mild exac, progression or S/E from tx 2 or more stable chronic illnesses Undiagnosed new problem w/ uncertain prognosis Acute complicated injury  Stress tests Endoscopies with no risk factors Deep needle or incisional bx CV imaging without risk LP Thoracentesis Paracentesis Minor surgery w/ risks Elective major surgery w/ no risk (open, percutaneous or endoscopic) Prescription drugs Therapeutic nucl  med IVF with additives Closed tx of fracture/dislocation    High Severe exac of chronic illness Acute or chronic illness/injury may pose a threat to life or bodily function (ARF) Change in neuro status    CV imaging w/ contrast and risk Cardio electophysiologic tests Endoscopies w/ risk Discography Elective major surgery Emergency major surgery Parenteral controlled substances Drug therapy req monitoring for toxicity DNR/de-escalation of care    MDM Prob points Data points Risk  Straightforward    <1    <1    Min   Low complexity    2    2    Low   Moderate    3    3    Mod   High Complexity    4 or more    4 or more    High      LOS: 0 days   RAMA,CHRISTINA  [facility] Pager [phone]. If unable to reach me by pager, please call my cell phone at [phone].  *Please refer to [email or web address], password [email or web address] to get updated schedule on who will round on this patient, as hospitalists switch teams weekly. If 7PM-7AM, please contact night-coverage at [email or web address], password [email or web address] for any overnight needs.  09/16/2016, 7:58 AM

## 2016-09-17 ENCOUNTER — Encounter (HOSPITAL_COMMUNITY): Payer: 59

## 2016-09-17 ENCOUNTER — Inpatient Hospital Stay (HOSPITAL_COMMUNITY): Payer: 59

## 2016-09-17 DIAGNOSIS — I159 Secondary hypertension, unspecified: Secondary | ICD-10-CM

## 2016-09-17 DIAGNOSIS — R6510 Systemic inflammatory response syndrome (SIRS) of non-infectious origin without acute organ dysfunction: Secondary | ICD-10-CM

## 2016-09-17 DIAGNOSIS — M7989 Other specified soft tissue disorders: Secondary | ICD-10-CM

## 2016-09-17 DIAGNOSIS — E871 Hypo-osmolality and hyponatremia: Secondary | ICD-10-CM

## 2016-09-17 LAB — GLUCOSE, CAPILLARY
GLUCOSE-CAPILLARY: 143 mg/dL — AB (ref 65–99)
Glucose-Capillary: 190 mg/dL — ABNORMAL HIGH (ref 65–99)
Glucose-Capillary: 147 mg/dL — ABNORMAL HIGH (ref 65–99)
Glucose-Capillary: 165 mg/dL — ABNORMAL HIGH (ref 65–99)

## 2016-09-17 LAB — CREATININE, SERUM: CREATININE: 0.94 mg/dL (ref 0.61–1.24)

## 2016-09-17 NOTE — Progress Notes (Signed)
Progress Note    Glenn Herrera  UKG:[id number] DOB: Jul 19, 1950  DOA: 09/15/2016 PCP: Gaynelle Arabian, MD    Brief Narrative:   Chief complaint: Follow-up right lower extremity pain/swelling secondary cellulitis  Medical records reviewed and are as summarized below:  Glenn Herrera is an 66 y.o. male with a PMH of hypertension, hyperlipidemia, diabetes, gout, and prostate cancer who was admitted 09/15/16 with a chief complaint of a six-day history of right lower leg and foot pain and swelling associated with fever and chills. He has failed outpatient therapy with Augmentin and Septra for cellulitis.  Assessment/Plan:   Principal Problem:   Sepsis secondary to Cellulitis of right lower extremity Failed outpatient therapy with appropriate antibiotics. Febrile. Blood cultures obtained. Lower extremity Dopplers ordered to rule out DVT, still pending. Continue empiric vancomycin and Zosyn given ongoing intense erythema. Monitor renal function closely while on vancomycin secondary to potential for nephrotoxicity. Re-check creatinine in a.m.  Active Problems:   Hypertension HCTZ/Lotensin held on admission. BP reasonable ong Lotensin.    Diabetes mellitus without complication ([place]) Currently being managed with insulin sensitive SSI. CBGs 137-162. Monitor and adjust therapy as needed.    HLD (hyperlipidemia) Continue Zocor.    Hyponatremia HCTZ on hold. Sodium 135.  HIV screening The patient falls between the ages of 13-64 and should be screened for HIV, therefore HIV testing ordered: Non-reactive.    Obesity Body mass index is 39.86 kg/m.   Family Communication/Anticipated D/C date and plan/Code Status   DVT prophylaxis: Lovenox ordered. Code Status: Full Code.  Family Communication: No family at bedside. Disposition Plan: Home in 24 hours if cellulitis show signs of regression.   Medical Consultants:    None.   Anti-Infectives:    Vancomycin  09/15/16--->  Zosyn 09/15/16--->  Subjective:   Patient reports RLE is less painful and that he can now bear weight. Mildly constipated.  ROS negative for fever, nausea/vomiting.  Objective:    Vitals:   09/16/16 0559 09/16/16 1434 09/16/16 2108 09/17/16 0529  BP: 134/63 (!) 137/59 133/77 130/84  Pulse: 67 86 71 65  Resp: _0 Temp: 99.1 F (37.3 C) 98.2 F (36.8 C) 99 F (37.2 C) 98.6 F (37 C)  TempSrc: Oral Oral Oral Oral  SpO2: 100% 95% 100% 100%  Weight: 122.4 kg (269 lb 14.4 oz)     Height:        Intake/Output Summary (Last 24 hours) at 09/17/16 1014 Last data filed at 09/17/16 0831  Gross per 24 hour  Intake             1750 ml  Output             3850 ml  Net            -2100 ml   Filed Weights   09/15/16 2128 09/16/16 0559  Weight: 122 kg (269 lb) 122.4 kg (269 lb 14.4 oz)    Exam: General exam: Unchanged: Obese male in no distress.  Edentulous. Respiratory system: Unchanged: lungs CTAB. Cardiovascular system: Unchanged: Heart RRR, no murmurs, rubs or gallops. Gastrointestinal system: Unchanged: Abdomen is obese with a midline hernia above umbilicus Central nervous system: Unchanged: Alert and oriented 3. Nonfocal. Extremities: RLE as pictured. RLE edematous, red, swollen with more intense erythema focally but regression from inked margins. Skin: Unchanged: Petechiae on all 4 extremities, worse on left arm.  RLE w/ erythema and swelling, scabbed area on 2nd toe where he says he  stepped on a pin that was in his shoe. Psychiatry: Mood and affect normal.    Data Reviewed:   I have personally reviewed following labs and imaging studies:  Labs: No new labs. Chemistries from 09/16/16 normal with the exception of mildly elevated glucose at 140 and an elevated total protein of 8.5. LFTs normal. CRP elevated at 22.5. ESR elevated 100. TSH 0.762. Lactic acid 1.1. Pro calcitonin 0.26. HIV nonreactive. Serum osmolality 291. Urine sodium 76. Urine osmolality  744.  Blood cultures pending.   Procedures and diagnostic studies:  Dg Chest 2 View 09/15/2016: Left lung base subsegmental atelectasis.    Medications:   . allopurinol  300 mg Oral QHS  . aspirin  325 mg Oral QHS  . benazepril  20 mg Oral Daily  . enoxaparin (LOVENOX) injection  60 mg Subcutaneous Daily  . insulin aspart  0-5 Units Subcutaneous QHS  . insulin aspart  0-9 Units Subcutaneous TID WC  . polyethylene glycol  17 g Oral Daily  . simvastatin  20 mg Oral QHS  . sodium chloride flush  3 mL Intravenous Q12H   Continuous Infusions: . piperacillin-tazobactam (ZOSYN)  IV 3.375 g (09/17/16 0749)  . vancomycin Stopped (09/17/16 0115)    Medical decision making is of high complexity and this patient is at high risk of deterioration, therefore this is a level 3 visit.  (> 4 problem points, >4 data points, high risk: Need 2 out of 3)   Problems/DDx Points   Self limited or minor (max 2)       1  1  Established problem, stable       1  3  Established problem, worsening       2   New problem, no additional W/U planned (max 1)       3    New problem, additional W/U planned        4    Data Reviewed Points   Review/order clinical lab tests       1   1  Review/order x-rays       1   Review/order tests (Echo, EKG, PFTs, etc)       1   Discussion of test results w/ performing MD       1   Independent review of image, tracing or specimen       2    Decision to obtain old records       1   Review and summation of old records       2      Level of risk Presenting prob Diagnostics Management   Minimal 1 self limited/minor Labs CXR EKG/EEG U/A U/S Rest Gargles Bandages Dressings   Low 2 or more self limited/minor 1 stable chronic Acute uncomplicated illness Tests (PFTS) Non-CV imaging Arterial labs Biopsies of skin OTC drugs Minor surgery-no risk PT OT IVF without additives    Moderate 1 or more chronic illnesses w/ mild exac, progression or S/E from tx 2 or more  stable chronic illnesses Undiagnosed new problem w/ uncertain prognosis Acute complicated injury  Stress tests Endoscopies with no risk factors Deep needle or incisional bx CV imaging without risk LP Thoracentesis Paracentesis Minor surgery w/ risks Elective major surgery w/ no risk (open, percutaneous or endoscopic) Prescription drugs Therapeutic nucl med IVF with additives Closed tx of fracture/dislocation    High Severe exac of chronic illness Acute or chronic illness/injury may pose a threat to life or bodily function (ARF) Change  in neuro status    CV imaging w/ contrast and risk Cardio electophysiologic tests Endoscopies w/ risk Discography Elective major surgery Emergency major surgery Parenteral controlled substances Drug therapy req monitoring for toxicity DNR/de-escalation of care    MDM Prob points Data points Risk   Straightforward    <1    <1    Min   Low complexity    2    2    Low   Moderate    3    3    Mod   High Complexity    4 or more    4 or more    High      LOS: 1 day   Amberlee Garvey  [facility] Pager [phone]. If unable to reach me by pager, please call my cell phone at [phone].  *Please refer to [email or web address], password [email or web address] to get updated schedule on who will round on this patient, as hospitalists switch teams weekly. If 7PM-7AM, please contact night-coverage at [email or web address], password [email or web address] for any overnight needs.  09/17/2016, 10:14 AM

## 2016-09-17 NOTE — Progress Notes (Signed)
VASCULAR LAB PRELIMINARY  PRELIMINARY  PRELIMINARY  PRELIMINARY  Right lower extremity venous duplex completed.    Preliminary report: Right:  No evidence of DVT, superficial thrombosis, or Baker's cyst.  Maridel Pixler, RVS 09/17/2016, 4:08 PM

## 2016-09-17 NOTE — Care Management Note (Signed)
Case Management Note  Patient Details  Name: Glenn Herrera MRN: [id number] Date of Birth: 1950-07-23  Subjective/Objective:                   Right lower leg and foot pain with swelling.  Action/Plan: Date:  September 17, 2016  Chart reviewed for concurrent status and case management needs.  Will continue to follow patient progress.  Discharge Planning: following for needs  Expected discharge date: [id number]  Velva Harman, BSN, [place], [place]   Expected Discharge Date:  09/18/16               Expected Discharge Plan:  Home/Self Care  In-House Referral:     Discharge planning Services  CM Consult  Post Acute Care Choice:    Choice offered to:     DME Arranged:    DME Agency:     HH Arranged:    HH Agency:     Status of Service:  In process, will continue to follow  If discussed at Long Length of Stay Meetings, dates discussed:    Additional Comments:  Leeroy Cha, RN 09/17/2016, 9:54 AM

## 2016-09-18 ENCOUNTER — Inpatient Hospital Stay (HOSPITAL_COMMUNITY): Payer: 59

## 2016-09-18 LAB — BASIC METABOLIC PANEL
Anion gap: 7 (ref 5–15)
BUN: 15 mg/dL (ref 6–20)
CHLORIDE: 99 mmol/L — AB (ref 101–111)
CO2: 28 mmol/L (ref 22–32)
CREATININE: 0.96 mg/dL (ref 0.61–1.24)
Calcium: 8.6 mg/dL — ABNORMAL LOW (ref 8.9–10.3)
GFR calc non Af Amer: >60 mL/min (ref >60)
Glucose, Bld: 142 mg/dL — ABNORMAL HIGH (ref 65–99)
POTASSIUM: 4.6 mmol/L (ref 3.5–5.1)
Sodium: 134 mmol/L — ABNORMAL LOW (ref 135–145)

## 2016-09-18 LAB — CBC
HEMATOCRIT: 33.6 % — AB (ref 39.0–52.0)
Hemoglobin: 11.2 g/dL — ABNORMAL LOW (ref 13.0–17.0)
MCH: 26.8 pg (ref 26.0–34.0)
MCHC: 33.3 g/dL (ref 30.0–36.0)
MCV: 80.4 fL (ref 78.0–100.0)
PLATELETS: 182 10*3/uL (ref 150–400)
RBC: 4.18 MIL/uL — AB (ref 4.22–5.81)
RDW: 14.9 % (ref 11.5–15.5)
WBC: 9 10*3/uL (ref 4.0–10.5)

## 2016-09-18 LAB — GLUCOSE, CAPILLARY
GLUCOSE-CAPILLARY: 147 mg/dL — AB (ref 65–99)
GLUCOSE-CAPILLARY: 184 mg/dL — AB (ref 65–99)
Glucose-Capillary: 131 mg/dL — ABNORMAL HIGH (ref 65–99)
Glucose-Capillary: 196 mg/dL — ABNORMAL HIGH (ref 65–99)

## 2016-09-18 MED ORDER — SENNOSIDES-DOCUSATE SODIUM 8.6-50 MG PO TABS
2.0000 | ORAL_TABLET | Freq: Two times a day (BID) | ORAL | Status: DC
  Administered 2016-09-18 – 2016-09-20 (×5): 2 via ORAL
  Filled 2016-09-18 (×5): qty 2

## 2016-09-18 NOTE — Progress Notes (Signed)
Patient ID: Glenn Herrera, male   DOB: Feb 02, 1951, 66 y.o.   MRN: [id number] Request received for possible right foot aspiration for culture on patient; case discussed with Dr. Annamaria Boots (pager [phone]) At this time he recommends continued IV antibiotic therapy and if patient worsens despite this can obtain lower extremity MRI with contrast for further evaluation as well as orthopedic consultation before considering any IR involvement.

## 2016-09-18 NOTE — Progress Notes (Signed)
Progress Note    Glenn Herrera  IZT:[id number] DOB: 12/29/1950  DOA: 09/15/2016 PCP: Gaynelle Arabian, MD    Brief Narrative:   Chief complaint: Follow-up right lower extremity pain/swelling secondary cellulitis  Medical records reviewed and are as summarized below:  Glenn Herrera is an 66 y.o. male with a PMH of hypertension, hyperlipidemia, diabetes, gout, and prostate cancer who was admitted 09/15/16 with a chief complaint of a six-day history of right lower leg and foot pain and swelling associated with fever and chills. He has failed outpatient therapy with Augmentin and Septra for cellulitis.  Assessment/Plan:   Principal Problem:   Sepsis secondary to Cellulitis of right lower extremity -Failed outpatient therapy with appropriate antibiotics. (he was given cipro/bactrium/augmentin prior to hospitalization) -Febrile. Fever 102 on admission, with sinus tachycardia and tachypnea on admission, now leukocytosis, lactic acid wnl -Blood cultures no growth. Lower extremity Dopplers  Negative for  DVT,  -he is stared on vancomycin and Zosyn since admission given ongoing intense erythema. -erythema improving on leg, dorsal foot remain erythematous, foot x ray did not mention osteo, he does has elevated esr/crp -he has h/o diabetic foot procedure in 07/2016, doral foot remain erythematous, concerning for focal fluids collection, foot x ray + soft tissu edema, I have discussed with interventional radiology for diagnostic /therapeutic [place] to right foot, IR recommend mri right lower leg and foot  Hyponatremia Sodium 129 on admission HCTZ on hold. Sodium normalized   Diabetes mellitus without complication ([place]), noninsulin dependent Home oral meds held Currently being managed with insulin sensitive SSI. CBGs 137-162. Monitor and adjust therapy as needed.  Hypertension HCTZ/Lotensin held on admission. bp stable     HLD (hyperlipidemia) Continue Zocor.  Constipation: stool  regimen     HIV screening The patient falls between the ages of 13-64 and should be screened for HIV, therefore HIV testing ordered: Non-reactive.    Obesity Body mass index is 39.86 kg/m.   Family Communication/Anticipated D/C date and plan/Code Status   DVT prophylaxis: Lovenox ordered. Code Status: Full Code.  Family Communication: No family at bedside. Disposition Plan: Home in 24 hours if cellulitis show signs of regression.   Medical Consultants:    None.   Anti-Infectives:    Vancomycin 09/15/16--->  Zosyn 09/15/16--->  Subjective:   Patient reports RLE is less painful and that he can now bear weight.  C/o being constipated,   ROS negative for fever, nausea/vomiting.  Objective:    Vitals:   09/17/16 2055 09/17/16 2308 09/18/16 0557 09/18/16 1420  BP: (!) 117/56  132/70 137/63  Pulse: 67  65 69  Resp: _0 Temp: 99.9 F (37.7 C) 98.5 F (36.9 C) 98.4 F (36.9 C) 98.3 F (36.8 C)  TempSrc: Oral Oral Oral Oral  SpO2: 98%  99% 100%  Weight:      Height:        Intake/Output Summary (Last 24 hours) at 09/18/16 1655 Last data filed at 09/18/16 1500  Gross per 24 hour  Intake             1520 ml  Output              575 ml  Net              945 ml   Filed Weights   09/15/16 2128 09/16/16 0559  Weight: 122 kg (269 lb) 122.4 kg (269 lb 14.4 oz)    Exam: General exam: Unchanged: Obese  male in no distress.  Edentulous. Respiratory system: Unchanged: lungs CTAB. Cardiovascular system: Unchanged: Heart RRR, no murmurs, rubs or gallops. Gastrointestinal system: Unchanged: Abdomen is obese with a midline hernia above umbilicus Central nervous system: Unchanged: Alert and oriented 3. Nonfocal. Extremities: RLE as pictured. RLE edematous, red, swollen with more intense erythema focally but regression from inked margins. Skin: Unchanged: Petechiae on all 4 extremities, worse on left arm.  RLE w/ erythema and swelling, scabbed area on 2nd toe  where he says he stepped on a pin that was in his shoe. Psychiatry: Mood and affect normal.    Data Reviewed:   Am labs, foot xray  Medications:   . allopurinol  300 mg Oral QHS  . aspirin  325 mg Oral QHS  . benazepril  20 mg Oral Daily  . enoxaparin (LOVENOX) injection  60 mg Subcutaneous Daily  . insulin aspart  0-5 Units Subcutaneous QHS  . insulin aspart  0-9 Units Subcutaneous TID WC  . polyethylene glycol  17 g Oral Daily  . senna-docusate  2 tablet Oral BID  . simvastatin  20 mg Oral QHS  . sodium chloride flush  3 mL Intravenous Q12H   Continuous Infusions: . piperacillin-tazobactam (ZOSYN)  IV Stopped (09/18/16 1158)  . vancomycin Stopped (09/18/16 1255)      LOS: 2 days    Time spent> 40mns  Travaughn Vue MD PhD [facility] Pager [phone] *Please refer to [email or web address], password [email or web address] to get updated schedule on who will round on this patient, as hospitalists switch teams weekly. If 7PM-7AM, please contact night-coverage at [email or web address], password [email or web address] for any overnight needs.  09/18/2016, 4:55 PM

## 2016-09-18 NOTE — Progress Notes (Addendum)
Pharmacy Antibiotic Note  Glenn Herrera is a 66 y.o. male with worsening swelling and redness to his right food and lower leg admitted on 09/15/2016 with sepsis.  Pharmacy has been consulted for zosyn and vancomycin dosing.  Today, 09/18/2016:  Afebrile, WBC remains WNL  Area of erythema improved  Plan:  Discussed narrowing Zosyn with MD today; would like to wait for Xray of foot to determine extent of dorsal wound. May convert directly to oral abx if Xray reassuring.  Continue Zosyn 3.375 g IV q8 hr by extended infusion  Continue Vancomycin 1000 mg q12 hr (VT=15-20 mg/L)  Daily Scr  F/u cultures/levels as indicated  Height: 5\' 9"  (175.3 cm) Weight: 269 lb 14.4 oz (122.4 kg) IBW/kg (Calculated) : 70.7  Temp (24hrs), Avg:98.8 F (37.1 C), Min:98.4 F (36.9 C), Max:99.9 F (37.7 C)   Recent Labs Lab 09/15/16 2148 09/15/16 2159 09/16/16 0011 09/16/16 0244 09/16/16 0524 09/17/16 0540 09/18/16 0605  WBC 7.5  --   --   --  5.9  --  9.0  CREATININE 1.04  --   --   --  1.13 0.94 0.96  LATICACIDVEN  --  1.21 1.45 1.1  --   --   --     Estimated Creatinine Clearance: 101.8 mL/min (by C-G formula based on SCr of 0.96 mg/dL).    No Known Allergies  Antimicrobials this admission: 7/10 zosyn >>  7/11 vancomycin >>   Dose adjustments this admission:  Microbiology results: 7/10 BCx: ngtd  Thank you for allowing pharmacy to be a part of this patient's care.  Reuel Boom, PharmD, BCPS Pager: [phone] 09/18/2016, 11:55 AM

## 2016-09-19 ENCOUNTER — Inpatient Hospital Stay (HOSPITAL_COMMUNITY): Payer: 59

## 2016-09-19 DIAGNOSIS — L03115 Cellulitis of right lower limb: Secondary | ICD-10-CM

## 2016-09-19 LAB — GLUCOSE, CAPILLARY
GLUCOSE-CAPILLARY: 238 mg/dL — AB (ref 65–99)
GLUCOSE-CAPILLARY: 189 mg/dL — AB (ref 65–99)
Glucose-Capillary: 194 mg/dL — ABNORMAL HIGH (ref 65–99)

## 2016-09-19 LAB — BASIC METABOLIC PANEL
ANION GAP: 8 (ref 5–15)
BUN: 17 mg/dL (ref 6–20)
CALCIUM: 9.2 mg/dL (ref 8.9–10.3)
CO2: 28 mmol/L (ref 22–32)
Chloride: 100 mmol/L — ABNORMAL LOW (ref 101–111)
Creatinine, Ser: 0.89 mg/dL (ref 0.61–1.24)
GFR calc non Af Amer: >60 mL/min (ref >60)
Glucose, Bld: 157 mg/dL — ABNORMAL HIGH (ref 65–99)
Potassium: 4.7 mmol/L (ref 3.5–5.1)
SODIUM: 136 mmol/L (ref 135–145)

## 2016-09-19 LAB — MAGNESIUM: MAGNESIUM: 2.2 mg/dL (ref 1.7–2.4)

## 2016-09-19 NOTE — Progress Notes (Signed)
Progress Note    Glenn Herrera  NVB:[id number] DOB: 1950-10-25  DOA: 09/15/2016 PCP: Gaynelle Arabian, MD    Brief Narrative:   Chief complaint: Follow-up right lower extremity pain/swelling secondary cellulitis  Medical records reviewed and are as summarized below:  Glenn Herrera is an 66 y.o. male with a PMH of hypertension, hyperlipidemia, diabetes, gout, and prostate cancer who was admitted 09/15/16 with a chief complaint of a six-day history of right lower leg and foot pain and swelling associated with fever and chills. He has failed outpatient therapy with Augmentin and Septra for cellulitis.  Assessment/Plan:   Principal Problem:    Sepsis secondary to Cellulitis of right lower extremity, myositis, and possible osteomyelitis -he has h/o diabetic foot procedure in 07/2016 by podiatrist  -Failed outpatient therapy with appropriate antibiotics. (he was given cipro/bactrium/augmentin prior to hospitalization) -Febrile. Fever 102 on admission, with sinus tachycardia and tachypnea on admission, no leukocytosis, lactic acid wnl, does has elevated esr/crp --Blood cultures no growth. Lower extremity Dopplers  Negative for  DVT,  -he is stared on vancomycin and Zosyn since admission given ongoing intense erythema/edema. -erythema improving on leg, dorsal foot remain erythematous, edematous and tender - mri right leg on 7/14  "Subcutaneous edema and diffuse muscular edema most intense in the distal 12 cm of the right lower leg is most consistent with cellulitis and myositis which could be infectious or inflammatory. No abscess or evidence of osteomyelitis is identified." -mri right foot  On 7/14 "Based on the appearance of the right lower leg and ankle, marrow edema in the midfoot is most worrisome for osteomyelitis rather than neuropathic change." - I have discussed with orthopedic Dr Sharol Given who is out of town, Dr Sharol Given recommend to contact Dr Samul Dada he come back in town, I have  contacted Dr Ninfa Linden who graciously accepted the consult even though this patient is a unassigned patient. He will see patient likely on 7/15.   Diabetes mellitus without complication ([place]), noninsulin dependent Home oral meds held Currently being managed with insulin sensitive SSI. CBGs 137-162. Monitor and adjust therapy as needed.  Hyponatremia Sodium 129 on admission HCTZ on hold. Sodium normalized  Hypertension HCTZ/Lotensin held on admission. bp stable     HLD (hyperlipidemia) Continue Zocor.  Constipation: stool regimen     HIV screening The patient falls between the ages of 13-64 and should be screened for HIV, therefore HIV testing ordered: Non-reactive.    Obesity Body mass index is 39.86 kg/m.   Family Communication/Anticipated D/C date and plan/Code Status   DVT prophylaxis: Lovenox ordered. Code Status: Full Code.  Family Communication: No family at bedside. Disposition Plan: to be determined    Medical Consultants:    None.   Anti-Infectives:    Vancomycin 09/15/16--->  Zosyn 09/15/16--->  Subjective:   Improving,right lower leg looks better, right ankle, right dorsal foot remain edematous, erythematous and tender No fever, vital stable Constipation has resolved    Objective:    Vitals:   09/18/16 0557 09/18/16 1420 09/18/16 2101 09/19/16 0540  BP: 132/70 137/63 118/61 117/60  Pulse: 65 69 79 70  Resp: _0 Temp: 98.4 F (36.9 C) 98.3 F (36.8 C) 98.5 F (36.9 C) 98.2 F (36.8 C)  TempSrc: Oral Oral Oral Oral  SpO2: 99% 100% 99% 97%  Weight:      Height:        Intake/Output Summary (Last 24 hours) at 09/19/16 1257 Last data filed at  09/19/16 0900  Gross per 24 hour  Intake             1030 ml  Output                0 ml  Net             1030 ml   Filed Weights   09/15/16 2128 09/16/16 0559  Weight: 122 kg (269 lb) 122.4 kg (269 lb 14.4 oz)    Exam: General exam: Obese male in no distress.   Edentulous. Respiratory system:  lungs CTAB. Cardiovascular system: Heart RRR, no murmurs, rubs or gallops. Gastrointestinal system: Abdomen is obese with a midline hernia above umbilicus Central nervous system:  Alert and oriented 3. Nonfocal. Extremities: RLE erythema has much improved, remain edematous, right ankle and right dorsal foot  right dorsal foot remain edematous, erythematous and tender  Skin:  Petechiae on all 4 extremities, worse on left arm.  RLE w/ erythema and swelling, scabbed area on 2nd toe where he says he stepped on a pin that was in his shoe. Psychiatry: Mood and affect normal.    Data Reviewed:   Am labs, foot xray  Medications:   . allopurinol  300 mg Oral QHS  . aspirin  325 mg Oral QHS  . benazepril  20 mg Oral Daily  . enoxaparin (LOVENOX) injection  60 mg Subcutaneous Daily  . insulin aspart  0-5 Units Subcutaneous QHS  . insulin aspart  0-9 Units Subcutaneous TID WC  . polyethylene glycol  17 g Oral Daily  . senna-docusate  2 tablet Oral BID  . simvastatin  20 mg Oral QHS  . sodium chloride flush  3 mL Intravenous Q12H   Continuous Infusions: . piperacillin-tazobactam (ZOSYN)  IV 3.375 g (09/19/16 0843)  . vancomycin 1,000 mg (09/19/16 1253)      LOS: 3 days    Time spent> 35mins  Xu,Fang MD PhD [facility] Pager [phone]  *Please refer to [email or web address], password [email or web address] to get updated schedule on who will round on this patient, as hospitalists switch teams weekly. If 7PM-7AM, please contact night-coverage at [email or web address], password [email or web address] for any overnight needs.  09/19/2016, 12:57 PM           

## 2016-09-19 NOTE — Consult Note (Signed)
Reason for Consult: Right foot and ankle redness concerning for infection versus Charcot arthropathy and changes Referring Physician: Dr. Florencia Reasons  Glenn Herrera is an 66 y.o. male.  HPI: The patient is a very pleasant 66 year old gentleman who is a diabetic. He does report peripheral neuropathy as well. Sometime a week or so ago he was at work in the yard and came and developed swelling and redness in his right foot. The next day try to put his boot on was difficulties having enough pain that he felt it may be gout. He may been treated by local podiatrist but he was admitted to the hospital here at [facility] on 09/16/2016 to treat cellulitis of the right lower extremity. An MRI has been obtained of his right foot and it did show some changes concerning for osteomyelitis versus Charcot changes. Orthopedic surgery has been asked to evaluate the patient further and make treatment recommendations. The patient again does report neuropathy in his feet and some pain but he says it is been easing off. From review of the records it does not look like he has had a hyper for white count at all and this is been normal.  Past Medical History:  Diagnosis Date  . Cancer [facility])    prostate   . Diabetes mellitus without complication ([place])   . High blood cholesterol   . HLD (hyperlipidemia)   . Hypertension     Past Surgical History:  Procedure Laterality Date  . LAPAROSCOPIC APPENDECTOMY N/A 02/06/2016   Procedure: APPENDECTOMY LAPAROSCOPIC;  Surgeon: Excell Seltzer, MD;  Location: [facility];  Service: General;  Laterality: N/A;  . PROSTATE SURGERY      Family History  Problem Relation Age of Onset  . Diabetes Mellitus II Mother   . Gout Mother   . Hypertension Father   . Diabetes Mellitus II Brother     Social History:  reports that he has never smoked. He has never used smokeless tobacco. He reports that he does not drink alcohol or use drugs.  Allergies: No Known Allergies  Medications: I have  reviewed the patient's current medications.  Results for orders placed or performed during the hospital encounter of 09/15/16 (from the past 48 hour(s))  Glucose, capillary     Status: Abnormal   Collection Time: 09/17/16  9:08 PM  Result Value Ref Range   Glucose-Capillary 165 (H) 65 - 99 mg/dL  Basic metabolic panel     Status: Abnormal   Collection Time: 09/18/16  6:05 AM  Result Value Ref Range   Sodium 134 (L) 135 - 145 mmol/L   Potassium 4.6 3.5 - 5.1 mmol/L   Chloride 99 (L) 101 - 111 mmol/L   CO2 28 22 - 32 mmol/L   Glucose, Bld 142 (H) 65 - 99 mg/dL   BUN 15 6 - 20 mg/dL   Creatinine, Ser 0.96 0.61 - 1.24 mg/dL   Calcium 8.6 (L) 8.9 - 10.3 mg/dL   GFR calc non Af Amer >60 >60 mL/min   GFR calc Af Amer >60 >60 mL/min    Comment: (NOTE) The eGFR has been calculated using the CKD EPI equation. This calculation has not been validated in all clinical situations. eGFR's persistently <60 mL/min signify possible Chronic Kidney Disease.    Anion gap 7 5 - 15  CBC     Status: Abnormal   Collection Time: 09/18/16  6:05 AM  Result Value Ref Range   WBC 9.0 4.0 - 10.5 K/uL   RBC  4.18 (L) 4.22 - 5.81 MIL/uL   Hemoglobin 11.2 (L) 13.0 - 17.0 g/dL   HCT 33.6 (L) 39.0 - 52.0 %   MCV 80.4 78.0 - 100.0 fL   MCH 26.8 26.0 - 34.0 pg   MCHC 33.3 30.0 - 36.0 g/dL   RDW 14.9 11.5 - 15.5 %   Platelets 182 150 - 400 K/uL  Glucose, capillary     Status: Abnormal   Collection Time: 09/18/16  7:33 AM  Result Value Ref Range   Glucose-Capillary 147 (H) 65 - 99 mg/dL  Glucose, capillary     Status: Abnormal   Collection Time: 09/18/16 11:56 AM  Result Value Ref Range   Glucose-Capillary 184 (H) 65 - 99 mg/dL  Glucose, capillary     Status: Abnormal   Collection Time: 09/18/16  5:22 PM  Result Value Ref Range   Glucose-Capillary 131 (H) 65 - 99 mg/dL  Glucose, capillary     Status: Abnormal   Collection Time: 09/18/16  9:04 PM  Result Value Ref Range   Glucose-Capillary 196 (H) 65 - 99  mg/dL  Basic metabolic panel     Status: Abnormal   Collection Time: 09/19/16  5:51 AM  Result Value Ref Range   Sodium 136 135 - 145 mmol/L   Potassium 4.7 3.5 - 5.1 mmol/L   Chloride 100 (L) 101 - 111 mmol/L   CO2 28 22 - 32 mmol/L   Glucose, Bld 157 (H) 65 - 99 mg/dL   BUN 17 6 - 20 mg/dL   Creatinine, Ser 0.89 0.61 - 1.24 mg/dL   Calcium 9.2 8.9 - 10.3 mg/dL   GFR calc non Af Amer >60 >60 mL/min   GFR calc Af Amer >60 >60 mL/min    Comment: (NOTE) The eGFR has been calculated using the CKD EPI equation. This calculation has not been validated in all clinical situations. eGFR's persistently <60 mL/min signify possible Chronic Kidney Disease.    Anion gap 8 5 - 15  Magnesium     Status: None   Collection Time: 09/19/16  5:51 AM  Result Value Ref Range   Magnesium 2.2 1.7 - 2.4 mg/dL  Glucose, capillary     Status: Abnormal   Collection Time: 09/19/16 11:41 AM  Result Value Ref Range   Glucose-Capillary 238 (H) 65 - 99 mg/dL  Glucose, capillary     Status: Abnormal   Collection Time: 09/19/16  4:33 PM  Result Value Ref Range   Glucose-Capillary 189 (H) 65 - 99 mg/dL   Comment 1 Notify RN     [name]  Result Date: 09/19/2016 CLINICAL DATA:  Redness, pain, swelling and cellulitis of the right lower leg to the foot. EXAM: MRI OF LOWER RIGHT EXTREMITY WITHOUT CONTRAST TECHNIQUE: Multiplanar, multisequence MR imaging of the right lower leg was performed. No intravenous contrast was administered. COMPARISON:  None. FINDINGS: Bones/Joint/Cartilage The axial and coronal sequences incidentally include the left lower leg. There is no bone marrow signal abnormality to suggest osteomyelitis. Ligaments Negative. Muscles and Tendons Edema is seen in all imaged muscles of the right lower leg and is most intense in the distal 12 cm of the lower leg. No intramuscular fluid collection is identified. Soft tissues Subcutaneous edema is present about the right lower  IMPRESSION: Subcutaneous edema and diffuse muscular edema most intense in the distal 12 cm of the right lower leg is most consistent with cellulitis and myositis which could be infectious or inflammatory. No abscess or  evidence of osteomyelitis is identified. Electronically Signed   By: Inge Rise M.D.   On: 09/19/2016 10:42   Mr Foot Right Wo Contrast  Addendum Date: 09/19/2016   ADDENDUM REPORT: 09/19/2016 10:42 ADDENDUM: This addendum is given as MR imaging of the right lower leg was not available at the time of the initial dictation. Based on the appearance of the right lower leg and ankle, marrow edema in the midfoot is most worrisome for osteomyelitis rather than neuropathic change. Electronically Signed   By: Inge Rise M.D.   On: 09/19/2016 10:42   Result Date: 09/19/2016 CLINICAL DATA:  Right lower leg and foot pain and swelling for approximately 10 days in a diabetic patient. Fever and chills. EXAM: MRI OF THE RIGHT FOREFOOT WITHOUT CONTRAST TECHNIQUE: Multiplanar, multisequence MR imaging of the right foot was performed. No intravenous contrast was administered. COMPARISON:  Plain films right foot 09/18/2016 FINDINGS: Bones/Joint/Cartilage Marrow edema and corresponding decreased T1 signal are seen in the navicular, cuneiform and to a somewhat milder degree cuboid. There is also some marrow edema in the bases of the second through fifth metatarsals appearing worst in the second, third and fourth. Scattered osteophytosis is seen about the bones the midfoot. There is first MTP joint space narrowing with medial worse than lateral sesamoid edema and edema in overlying head of the first metatarsal. There appear to be erosions about the first MTP joint without associated marrow edema. There is partial visualization of subchondral cysts in the medial talar dome. Ligaments Intact. Muscles and Tendons No intramuscular fluid collection is identified. There is marked atrophy of musculature of the  foot. Soft tissues Subcutaneous edema is present about the dorsum of the foot without focal fluid collection. No mass. IMPRESSION: Extensive marrow edema throughout the bones of the midfoot is nonspecific and could be due to neuropathic change or possibly infection. The extent of edema and absence of the visualized wound make infection somewhat less likely although infection cannot be excluded. Intense subcutaneous edema over the dorsum of the foot compatible with cellulitis or dependent change. 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 Absence of edema about the erosions or a joint effusion make active gout less unlikely. Electronically Signed: By: Inge Rise M.D. On: 09/19/2016 10:11   Dg Foot 2 Views Right  Result Date: 09/18/2016 CLINICAL DATA:  Diffuse right foot pain with redness and swelling x 9 days; no injury; r/o infection EXAM: RIGHT FOOT - 2 VIEW COMPARISON:  None. FINDINGS: There is no evidence of fracture or dislocation. Mild degenerative change at the first MTP joint without convincing cortical erosions. Calcaneal spurs. Dorsal spurring in the midfoot and marginal spurring about the tibiotalar articulation. Normal mineralization. Soft tissue swelling about the metatarsals. No radiodense foreign body or subcutaneous gas. Soft tissues are unremarkable. IMPRESSION: 1. Open bone abnormality.  Degenerative changes as detailed above. 2. Soft tissue swelling centered about the metatarsals without radiodense foreign body or subcutaneous gas. Electronically Signed   By: Lucrezia Europe M.D.   On: 09/18/2016 10:35    ROS Blood pressure (!) 114/57, pulse 71, temperature 98.5 F (36.9 C), temperature source Oral, resp. rate 20, height _0  (1.753 m), weight 269 lb 14.4 oz (122.4 kg), SpO2 99 %. Physical Exam  Constitutional: He is oriented to person, place, and time. He appears well-developed and well-nourished.  HENT:  Head:  Normocephalic and atraumatic.  Eyes: Pupils are equal, round, and reactive to light. EOM are  normal.  Neck: Normal range of motion. Neck supple.  Cardiovascular: Normal rate.   Respiratory: Effort normal.  GI: Soft.  Musculoskeletal:       Right ankle: He exhibits swelling.  Neurological: He is alert and oriented to person, place, and time.   Examination of his right foot and leg does show diffuse redness there is only mild and it appears to be lessening from March 2 C on his leg. There is no significant induration. His ankle leg and foot on the right side showed no open wounds at all. He does have decreased sensation globally on both feet. There is again no outward evidence of infection other than some mild cellulitis.  I did independently review the x-rays and the MRI. It appears to me that the edema that is seen in the navicular and cuboid bones as well as the metatarsals is more consistent with Charcot neuropathic changes that infection. 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 This is not appear to be a deep infectious process at all. It is not reflected to be an infection based on his laboratory evaluation and clinical exam. I talked to him in length about this and he understands the treatment plan would be mainly just observation and slowly return to activities once the Charcot arthropathy changes run their course. He can follow up with Dr. Sharol Given at [place] as an outpatient anytime in the next 2-3 weeks.  Mcarthur Rossetti 09/19/2016, 5:44 PM

## 2016-09-20 LAB — CREATININE, SERUM
Creatinine, Ser: 1 mg/dL (ref 0.61–1.24)
GFR calc non Af Amer: >60 mL/min (ref >60)

## 2016-09-20 LAB — GLUCOSE, CAPILLARY
GLUCOSE-CAPILLARY: 194 mg/dL — AB (ref 65–99)
Glucose-Capillary: 171 mg/dL — ABNORMAL HIGH (ref 65–99)

## 2016-09-20 LAB — VANCOMYCIN, TROUGH: Vancomycin Tr: 18 ug/mL (ref 15–20)

## 2016-09-20 MED ORDER — POLYETHYLENE GLYCOL 3350 17 G PO PACK: 17.0000 g | PACK | Freq: Every day | ORAL | 0 refills | Status: DC

## 2016-09-20 MED ORDER — CIPROFLOXACIN HCL 500 MG PO TABS: 500.0000 mg | ORAL_TABLET | Freq: Two times a day (BID) | ORAL | 0 refills | Status: AC

## 2016-09-20 MED ORDER — SACCHAROMYCES BOULARDII 250 MG PO CAPS: 250.0000 mg | ORAL_CAPSULE | Freq: Two times a day (BID) | ORAL | 0 refills | Status: DC

## 2016-09-20 MED ORDER — DOXYCYCLINE HYCLATE 100 MG PO CAPS: 100.0000 mg | ORAL_CAPSULE | Freq: Two times a day (BID) | ORAL | 0 refills | Status: AC

## 2016-09-20 MED ORDER — SENNOSIDES-DOCUSATE SODIUM 8.6-50 MG PO TABS: 1.0000 | ORAL_TABLET | Freq: Every day | ORAL | 0 refills | Status: DC

## 2016-09-20 MED ORDER — HYDROCODONE-ACETAMINOPHEN 5-325 MG PO TABS: 1.0000 | ORAL_TABLET | Freq: Four times a day (QID) | ORAL | 0 refills | Status: DC | PRN

## 2016-09-20 MED ORDER — ACETAMINOPHEN 325 MG PO TABS: 325.0000 mg | ORAL_TABLET | Freq: Four times a day (QID) | ORAL | 0 refills | Status: DC | PRN

## 2016-09-20 NOTE — Progress Notes (Signed)
Pt leaving this afternoon with his spouse. Alert, oriented, and without c/o. Discharge instructions/prescriptions given/explained with pt and spouse verbalizing understanding. Followup appointments noted.  Pt left with prescriptions, cell phone, clothes, and work note.

## 2016-09-20 NOTE — Discharge Summary (Signed)
Discharge Summary  Glenn Herrera JHE:[id number] DOB: Dec 28, 1950  PCP: Glenn Arabian, MD  Admit date: 09/15/2016 Discharge date: 09/20/2016  Time spent: >70mns, more than 50% time spent on coordination of care.  Recommendations for Outpatient Follow-up:  1. F/u with PMD within a week  for hospital discharge follow up, repeat cbc/bmp at follow up 2. F/u with orthopedics Dr DSharol Givenin two weeks 3. Work note provided   Discharge Diagnoses:  Active Hospital Problems   Diagnosis Date Noted  . Cellulitis of right lower extremity 09/16/2016  . Cellulitis of right foot 09/16/2016  . Sepsis ([place] 09/16/2016  . Hyponatremia 09/16/2016  . Hypertension   . HLD (hyperlipidemia)   . Diabetes mellitus without complication ([facility]     Resolved Hospital Problems   Diagnosis Date Noted Date Resolved  No resolved problems to display.    Discharge Condition: stable  Diet recommendation: heart healthy/carb modified  Filed Weights   09/15/16 2128 09/16/16 0559  Weight: 122 kg (269 lb) 122.4 kg (269 lb 14.4 oz)    History of present illness:  PCP: EGaynelle Arabian MD   Patient coming from:  The patient is coming from home.  At baseline, pt is independent for most of ADL.   Chief Complaint: Right lower leg and foot pain, swelling  HPI: Glenn ANGUIANOis a 66y.o. male with medical history significant of hypertension, hyperlipidemia, diabetes mellitus, gout, prostate cancer, who presents with right lower leg and foot pain in his swelling.  Patient states that he has been having right lower leg and foot pain and swelling for almost 6 days. His R lower leg and foot are also erythematous. The pain is constant, 6 out of 10 in severity, sharp, nonradiating. Patient also has fever and chills. Patient was seen by PCP and initially treated for gout without improvement. Then he was started with 2 antibiotics, Augmentin and Septra yesterday, still no any help. Patient has worsening pain. Patient does  not have chest pain, shortness of breath, cough, nausea, vomiting, diarrhea, symptoms of UTI or unilateral weakness.  ED Course: pt was found to have WBC 7.5, lactic acid 1.21, sodium 129, creatinine 1.04, negative urinalysis, temperature 102.2, tachycardia, tachypnea, oxygen saturation normal. Patient is admitted to telemetry bed as inpatient.  Hospital Course:  Principal Problem:   Cellulitis of right lower extremity Active Problems:   Hypertension   Diabetes mellitus without complication (HCC)   HLD (hyperlipidemia)   Cellulitis of right foot   Sepsis ([place]   Hyponatremia   Sepsis secondary to Cellulitis of right lower extremity, myositis, and possible osteomyelitis -he has h/o diabetic foot procedure in 07/2016 by podiatrist  -Failed outpatient therapy with appropriate antibiotics. ( he was given cipro/augmentin, then prescribed bactrim prior to this  Hospitalization, he has not started taking bactrim prior to coming to the hospital)  -he presented with Fever 102 on admission, with sinus tachycardia and tachypnea on admission, no leukocytosis, lactic acid wnl, does has elevated esr/crp, he has intense erythema and edema of the right lower leg on admission  --Blood cultures no growth. Lower extremity Dopplers  Negative for  DVT,  --MRI right leg on 7/14  "Subcutaneous edema and diffuse muscular edema most intense in the distal 12 cm of the right lower leg is most consistent with cellulitis and myositis which could be infectious or inflammatory. No abscess or evidence of osteomyelitis is identified." -MRI right foot  On 7/14 "Based on the appearance of the right lower leg and ankle, marrow  edema in the midfoot is most worrisome for osteomyelitis rather than neuropathic change."  - I have discussed with orthopedic Dr Sharol Given who is out of town, Dr Sharol Given recommend to contact Dr Samul Dada he come back in town, -Dr Ninfa Linden who graciously accepted the consult , per Dr Ninfa Linden the bone  changes shown on MRI is more consistent with charcot foot, he recommended patient to follow up with Dr Sharol Given in two weeks.  -right lower leg has much improved, right dorsal foot erythema/edema is imrpving,   -he is stared on vancomycin and Zosyn since admission and received total of 5 days iv abx in the hospital,  he is discharged on cipro and doxycycline  for total of 9days and follow up with Dr Sharol Given in two weeks   Diabetes mellitus without complication ([place]), noninsulin dependent -Home oral meds held in the hospital, resumed at discharge -He received ssi in the hospital.  -Am CBGs 137-162 while in the hospital. -pmd follow up.  Hyponatremia -Sodium 129 on admission -HCTZ on hold. Sodium normalized -hctz restarted at discharge, pmd to repeat bmp at follow up.  Hypertension -HCTZ/Lotensin held in the hospital, resumed at discharge     HLD (hyperlipidemia) Continue Zocor.  Constipation: stool regimen     HIV screening The patient falls between the ages of 13-64 and should be screened for HIV, therefore HIV testing ordered: Non-reactive.   Obesity/OSA On cpap Body mass index is 39.86 kg/m.   Family Communication/Anticipated D/C date and plan/Code Status   DVT prophylaxis: Lovenox ordered. Code Status: Full Code.  Family Communication: No family at bedside. Disposition Plan: home    Medical Consultants:    Orthopedic surgery Dr Sharol Given who is out of town over the phone, Dr Ninfa Linden staffed the formal consult   Anti-Infectives:    Vancomycin 09/15/16--->  Zosyn 09/15/16--->   Discharge Exam: BP (!) 141/77 (BP Location: Left Arm)   Pulse 68   Temp 97.8 F (36.6 C) (Oral)   Resp 20   Ht '5\' 9"'$  (1.753 m)   Wt 122.4 kg (269 lb 14.4 oz)   SpO2 99%   BMI 39.86 kg/m   General: obese, NAD, Edentulous Cardiovascular: RRR Respiratory: CTABL Extremities: RLE erythema has much improved, less edematous,  right ankle and right dorsal foot  right  dorsal foot less edematous, less erythematous, less tender   Discharge Instructions You were cared for by a hospitalist during your hospital stay. If you have any questions about your discharge medications or the care you received while you were in the hospital after you are discharged, you can call the unit and asked to speak with the hospitalist on call if the hospitalist that took care of you is not available. Once you are discharged, your primary care physician will handle any further medical issues. Please note that NO REFILLS for any discharge medications will be authorized once you are discharged, as it is imperative that you return to your primary care physician (or establish a relationship with a primary care physician if you do not have one) for your aftercare needs so that they can reassess your need for medications and monitor your lab values.  Discharge Instructions    Diet - low sodium heart healthy    Complete by:  As directed    Carb modified diet   Increase activity slowly    Complete by:  As directed      Allergies as of 09/20/2016   No Known Allergies     Medication  List    STOP taking these medications   amoxicillin-clavulanate 875-125 MG tablet Commonly known as:  AUGMENTIN   sulfamethoxazole-trimethoprim 800-160 MG tablet Commonly known as:  BACTRIM DS,SEPTRA DS     TAKE these medications   acetaminophen 325 MG tablet Commonly known as:  TYLENOL Take 1 tablet (325 mg total) by mouth every 6 (six) hours as needed. What changed:  how much to take  when to take this   allopurinol 300 MG tablet Commonly known as:  ZYLOPRIM Take 300 mg by mouth at bedtime.   aspirin 325 MG tablet Take 325 mg by mouth at bedtime.   benazepril-hydrochlorthiazide 20-12.5 MG tablet Commonly known as:  LOTENSIN HCT Take 1 tablet by mouth at bedtime.   ciprofloxacin 500 MG tablet Commonly known as:  CIPRO Take 1 tablet (500 mg total) by mouth 2 (two) times daily.     doxycycline 100 MG capsule Commonly known as:  VIBRAMYCIN Take 1 capsule (100 mg total) by mouth 2 (two) times daily.   glimepiride 2 MG tablet Commonly known as:  AMARYL Take 2 mg by mouth every evening.   HYDROcodone-acetaminophen 5-325 MG tablet Commonly known as:  NORCO/VICODIN Take 1 tablet by mouth every 6 (six) hours as needed for moderate pain or severe pain. What changed:  how much to take   metFORMIN 500 MG 24 hr tablet Commonly known as:  GLUCOPHAGE-XR Take 2 tablets (1,000 mg total) by mouth 2 times daily at 12 noon and 4 pm. Resume as before and restart at bedtime tonight, 02/07/16.   polyethylene glycol packet Commonly known as:  MIRALAX / GLYCOLAX Take 17 g by mouth daily.   saccharomyces boulardii 250 MG capsule Commonly known as:  FLORASTOR Take 1 capsule (250 mg total) by mouth 2 (two) times daily.   senna-docusate 8.6-50 MG tablet Commonly known as:  Senokot-S Take 1 tablet by mouth at bedtime.   simvastatin 20 MG tablet Commonly known as:  ZOCOR Take 20 mg by mouth at bedtime.      No Known Allergies Follow-up Information    Newt Minion, MD. Schedule an appointment as soon as possible for a visit in 2 week(s).   Specialty:  Orthopedic Surgery Contact information: [place] [address] [phone]        Glenn Arabian, MD Follow up in 1 week(s).   Specialty:  Family Medicine Why:  hospital discharge follow up, repeat cbc/bmp at follow up. Contact information: [address] [place] [address] [phone]            The results of significant diagnostics from this hospitalization (including imaging, microbiology, ancillary and laboratory) are listed below for reference.    Significant Diagnostic Studies: Dg Chest 2 View  Result Date: 09/15/2016 CLINICAL DATA:  66 year old male with fever. EXAM: CHEST  2 VIEW COMPARISON:  Chest radiograph dated 02/05/2016 FINDINGS: Minimal left lung  base atelectatic changes. Infiltrate is not excluded. No focal consolidation, pleural effusion, or pneumothorax. Top-normal cardiac size. No acute osseous pathology. IMPRESSION: Left lung base subsegmental atelectasis. Infiltrate is less likely. Clinical correlation is recommended. Electronically Signed   By: Anner Crete M.D.   On: 09/15/2016 23:39   [name]  Result Date: 09/19/2016 CLINICAL DATA:  Redness, pain, swelling and cellulitis of the right lower leg to the foot. EXAM: MRI OF LOWER RIGHT EXTREMITY WITHOUT CONTRAST TECHNIQUE: Multiplanar, multisequence MR imaging of the right lower leg was performed. No intravenous  contrast was administered. COMPARISON:  None. FINDINGS: Bones/Joint/Cartilage The axial and coronal sequences incidentally include the left lower leg. There is no bone marrow signal abnormality to suggest osteomyelitis. Ligaments Negative. Muscles and Tendons Edema is seen in all imaged muscles of the right lower leg and is most intense in the distal 12 cm of the lower leg. No intramuscular fluid collection is identified. Soft tissues Subcutaneous edema is present about the right lower IMPRESSION: Subcutaneous edema and diffuse muscular edema most intense in the distal 12 cm of the right lower leg is most consistent with cellulitis and myositis which could be infectious or inflammatory. No abscess or evidence of osteomyelitis is identified. Electronically Signed   By: Inge Rise M.D.   On: 09/19/2016 10:42   Mr Foot Right Wo Contrast  Addendum Date: 09/19/2016   ADDENDUM REPORT: 09/19/2016 10:42 ADDENDUM: This addendum is given as MR imaging of the right lower leg was not available at the time of the initial dictation. Based on the appearance of the right lower leg and ankle, marrow edema in the midfoot is most worrisome for osteomyelitis rather than neuropathic change. Electronically Signed   By: Inge Rise M.D.   On: 09/19/2016 10:42   Result  Date: 09/19/2016 CLINICAL DATA:  Right lower leg and foot pain and swelling for approximately 10 days in a diabetic patient. Fever and chills. EXAM: MRI OF THE RIGHT FOREFOOT WITHOUT CONTRAST TECHNIQUE: Multiplanar, multisequence MR imaging of the right foot was performed. No intravenous contrast was administered. COMPARISON:  Plain films right foot 09/18/2016 FINDINGS: Bones/Joint/Cartilage Marrow edema and corresponding decreased T1 signal are seen in the navicular, cuneiform and to a somewhat milder degree cuboid. There is also some marrow edema in the bases of the second through fifth metatarsals appearing worst in the second, third and fourth. Scattered osteophytosis is seen about the bones the midfoot. There is first MTP joint space narrowing with medial worse than lateral sesamoid edema and edema in overlying head of the first metatarsal. There appear to be erosions about the first MTP joint without associated marrow edema. There is partial visualization of subchondral cysts in the medial talar dome. Ligaments Intact. Muscles and Tendons No intramuscular fluid collection is identified. There is marked atrophy of musculature of the foot. Soft tissues Subcutaneous edema is present about the dorsum of the foot without focal fluid collection. No mass. IMPRESSION: Extensive marrow edema throughout the bones of the midfoot is nonspecific and could be due to neuropathic change or possibly infection. The extent of edema and absence of the visualized wound make infection somewhat less likely although infection cannot be excluded. Intense subcutaneous edema over the dorsum of the foot compatible with cellulitis or dependent change. 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 Absence of edema about the erosions or a joint effusion make active gout less unlikely. Electronically Signed: By: Inge Rise M.D. On: 09/19/2016 10:11   Dg Foot 2 Views  Right  Result Date: 09/18/2016 CLINICAL DATA:  Diffuse right foot pain with redness and swelling x 9 days; no injury; r/o infection EXAM: RIGHT FOOT - 2 VIEW COMPARISON:  None. FINDINGS: There is no evidence of fracture or dislocation. Mild degenerative change at the first MTP joint without convincing cortical erosions. Calcaneal spurs. Dorsal spurring in the midfoot and marginal spurring about the tibiotalar articulation. Normal mineralization. Soft tissue swelling about the metatarsals. No radiodense foreign body or subcutaneous gas. Soft tissues are unremarkable. IMPRESSION: 1.  Open bone abnormality.  Degenerative changes as detailed above. 2. Soft tissue swelling centered about the metatarsals without radiodense foreign body or subcutaneous gas. Electronically Signed   By: Lucrezia Europe M.D.   On: 09/18/2016 10:35    Microbiology: Recent Results (from the past 240 hour(s))  Blood Culture (routine x 2)     Status: None (Preliminary result)   Collection Time: 09/15/16 11:38 PM  Result Value Ref Range Status   Specimen Description BLOOD BLOOD LEFT ARM  Final   Special Requests   Final    BOTTLES DRAWN AEROBIC AND ANAEROBIC Blood Culture adequate volume   Culture   Final    NO GROWTH 3 DAYS Performed at [facility], [address] [place], [place] [address]    Report Status PENDING  Incomplete  Blood Culture (routine x 2)     Status: None (Preliminary result)   Collection Time: 09/15/16 11:46 PM  Result Value Ref Range Status   Specimen Description BLOOD BLOOD RIGHT ARM  Final   Special Requests   Final    BOTTLES DRAWN AEROBIC AND ANAEROBIC Blood Culture adequate volume   Culture   Final    NO GROWTH 3 DAYS Performed at [facility], [place] [address] [place], [place] [address]    Report Status PENDING  Incomplete     Labs: Basic Metabolic Panel:  Recent Labs Lab 09/15/16 2148 09/16/16 0524 09/17/16 0540 09/18/16 0605 09/19/16 0551 09/20/16 0544  NA 129* 135  --   134* 136  --   K 4.6 4.4  --  4.6 4.7  --   CL 96* 102  --  99* 100*  --   CO2 22 24  --  28 28  --   GLUCOSE 156* 140*  --  142* 157*  --   BUN 20 19  --  15 17  --   CREATININE 1.04 1.13 0.94 0.96 0.89 1.00  CALCIUM 8.6* 8.2*  --  8.6* 9.2  --   MG  --   --   --   --  2.2  --    Liver Function Tests:  Recent Labs Lab 09/15/16 2148  AST 30  ALT 32  ALKPHOS 72  BILITOT 0.6  PROT 8.5*  ALBUMIN 3.8   No results for input(s): LIPASE, AMYLASE in the last 168 hours. No results for input(s): AMMONIA in the last 168 hours. CBC:  Recent Labs Lab 09/15/16 2148 09/16/16 0524 09/18/16 0605  WBC 7.5 5.9 9.0  NEUTROABS 5.6  --   --   HGB 13.3 13.6 11.2*  HCT 38.3* 39.9 33.6*  MCV 78.5 81.1 80.4  PLT 155 PLATELET CLUMPS NOTED ON SMEAR, COUNT APPEARS ADEQUATE 182   Cardiac Enzymes: No results for input(s): CKTOTAL, CKMB, CKMBINDEX, TROPONINI in the last 168 hours. BNP: BNP (last 3 results) No results for input(s): BNP in the last 8760 hours.  ProBNP (last 3 results) No results for input(s): PROBNP in the last 8760 hours.  CBG:  Recent Labs Lab 09/19/16 1141 09/19/16 1633 09/19/16 2201 09/20/16 0749 09/20/16 1130  GLUCAP 238* 189* 194* 171* 194*       Signed:  Nylani Michetti MD, PhD  [facility] 09/20/2016, 1:01 PM

## 2016-09-21 LAB — CULTURE, BLOOD (ROUTINE X 2)
CULTURE: NO GROWTH
CULTURE: NO GROWTH
SPECIAL REQUESTS: ADEQUATE
Special Requests: ADEQUATE

## 2016-09-21 LAB — GLUCOSE, CAPILLARY: GLUCOSE-CAPILLARY: 170 mg/dL — AB (ref 65–99)

## 2016-09-22 LAB — HEMOGLOBIN A1C
Hgb A1c MFr Bld: 7.5 % — ABNORMAL HIGH (ref 4.8–5.6)
Mean Plasma Glucose: 169 mg/dL

## 2016-09-25 ENCOUNTER — Ambulatory Visit (INDEPENDENT_AMBULATORY_CARE_PROVIDER_SITE_OTHER): Payer: 59 | Admitting: Family

## 2016-09-25 VITALS — Ht 69.0 in | Wt 269.0 lb

## 2016-09-25 DIAGNOSIS — E119 Type 2 diabetes mellitus without complications: Secondary | ICD-10-CM | POA: Diagnosis not present

## 2016-09-25 DIAGNOSIS — L03115 Cellulitis of right lower limb: Secondary | ICD-10-CM | POA: Diagnosis not present

## 2016-09-28 NOTE — Progress Notes (Signed)
Office Visit Note   Patient: Glenn Herrera           Date of Birth: 21-Aug-1950           MRN: [id number] Visit Date: 09/25/2016              Requested by: Gaynelle Arabian, MD [address] [place], [place] [address] PCP: Gaynelle Arabian, MD  Chief Complaint  Patient presents with  . Right Leg - Follow-up      HPI: The patient is a 66 year old gentleman seen today in follow-up following a hospitalization for cellulitis and sepsis related to infection of his right lower extremity. He does not recall an injury or wound. Does relate to being at [facility] prior to this incident. Received vancomycin and Zosyn while hospitalized was sent home on doxycycline and Cipro orally. States he is still taking these courses. Wonders when he can return to work. Complains of continued swelling and redness to his foot.  Assessment & Plan: Visit Diagnoses:  1. Cellulitis of right foot   2. Diabetes mellitus without complication (HCC)     Plan: Cellulitis resolving. No open ulcer. Will have him complete his abx course. Follow up in office if does not resolve or has any concerns.   Follow-Up Instructions: Return in about 2 weeks (around 10/09/2016), or if symptoms worsen or fail to improve.   Ortho Exam  Patient is alert, oriented, no adenopathy, well-dressed, normal affect, normal respiratory effort. The right lower extremity with without open ulceration. No drainage. Resolving cellulitis to the RLE. Mild erythema to the foot. No induration, no pitting edema to RLE. No weeping.   Imaging: No results found.  Labs: Lab Results  Component Value Date   HGBA1C 7.5 (H) 09/19/2016   ESRSEDRATE 100 (H) 09/16/2016   CRP 22.5 (H) 09/16/2016   REPTSTATUS 09/21/2016 FINAL 09/15/2016   CULT  09/15/2016    NO GROWTH 5 DAYS Performed at [facility], [place] [address], [place], [place] [address]    LABORGA ESCHERICHIA COLI (A) 05/23/2016   LABORGA ENTEROCOCCUS FAECALIS (A) 05/23/2016     Orders:  No orders of the defined types were placed in this encounter.  No orders of the defined types were placed in this encounter.    Procedures: No procedures performed  Clinical Data: No additional findings.  ROS:  All other systems negative, except as noted in the HPI. Review of Systems  Constitutional: Negative for chills and fever.  Cardiovascular: Negative for leg swelling.  Skin: Positive for color change. Negative for rash and wound.    Objective: Vital Signs: Ht 5\' 9"  (1.753 m)   Wt 269 lb (122 kg)   BMI 39.72 kg/m   Specialty Comments:  No specialty comments available.  PMFS History: Patient Active Problem List   Diagnosis Date Noted  . Cellulitis of right lower extremity 09/16/2016  . Cellulitis of right foot 09/16/2016  . Sepsis ([place]) 09/16/2016  . Hyponatremia 09/16/2016  . Hypertension   . Diabetes mellitus without complication ([place])   . HLD (hyperlipidemia)   . Appendicitis 02/05/2016  . Left inguinal hernia 04/06/2013   Past Medical History:  Diagnosis Date  . Cancer [facility])    prostate   . Diabetes mellitus without complication ([place])   . High blood cholesterol   . HLD (hyperlipidemia)   . Hypertension     Family History  Problem Relation Age of Onset  . Diabetes Mellitus II Mother   . Gout  Mother   . Hypertension Father   . Diabetes Mellitus II Brother     Past Surgical History:  Procedure Laterality Date  . LAPAROSCOPIC APPENDECTOMY N/A 02/06/2016   Procedure: APPENDECTOMY LAPAROSCOPIC;  Surgeon: Excell Seltzer, MD;  Location: [facility] ORS;  Service: General;  Laterality: N/A;  . PROSTATE SURGERY     Social History   Occupational History  . Not on file.   Social History Main Topics  . Smoking status: Never Smoker  . Smokeless tobacco: Never Used  . Alcohol use No  . Drug use: No  . Sexual activity: Not on file

## 2017-02-25 ENCOUNTER — Encounter: Payer: Self-pay | Admitting: Podiatry

## 2017-02-25 ENCOUNTER — Ambulatory Visit (INDEPENDENT_AMBULATORY_CARE_PROVIDER_SITE_OTHER): Payer: 59

## 2017-02-25 ENCOUNTER — Ambulatory Visit: Payer: 59 | Admitting: Podiatry

## 2017-02-25 ENCOUNTER — Other Ambulatory Visit: Payer: Self-pay | Admitting: Podiatry

## 2017-02-25 DIAGNOSIS — E11610 Type 2 diabetes mellitus with diabetic neuropathic arthropathy: Secondary | ICD-10-CM | POA: Diagnosis not present

## 2017-02-25 DIAGNOSIS — M79671 Pain in right foot: Secondary | ICD-10-CM

## 2017-02-25 NOTE — Progress Notes (Signed)
Subjective:   Patient ID: Glenn Herrera, male   DOB: 66 y.o.   MRN: [id number]   HPI Patient presents right stating he has had swelling of his right foot for the last few months and had a tentative diagnosis of cellulitis and was in the hospital but never has had any openings.  He states the foot is warm and that it hurts after he has to walk and be on his foot with work   ROS      Objective:  Physical Exam  Neurovascular status unchanged with patient was noted right foot to have quite a bit of warmth in the forefoot midfoot and into the ankle with negative Homans sign noted.  There also appears to be depression of the arch noted on the right side with long-term diabetic is in good control but was not in good control for an extended period of time     Assessment:  Probability for Charcot foot right versus cellulitis or osteomyelitis     Plan:  H&P x-rays reviewed and at this point I applied her fracture walker to mobilize discussed that we may apply an Unna boot or something to try to reduce swelling and we will have him back again in the next 2 weeks.  He is off of work so hopefully this will help his condition may require MRI  X-rays indicate there is significant changes in the midfoot with indications of probable Charcot foot

## 2017-02-25 NOTE — Patient Instructions (Signed)

## 2017-03-08 ENCOUNTER — Ambulatory Visit: Payer: 59 | Admitting: Podiatry

## 2017-03-17 ENCOUNTER — Encounter: Payer: Self-pay | Admitting: Podiatry

## 2017-03-17 ENCOUNTER — Ambulatory Visit: Payer: 59 | Admitting: Podiatry

## 2017-03-17 DIAGNOSIS — L84 Corns and callosities: Secondary | ICD-10-CM

## 2017-03-17 DIAGNOSIS — E1149 Type 2 diabetes mellitus with other diabetic neurological complication: Secondary | ICD-10-CM

## 2017-03-17 DIAGNOSIS — E11610 Type 2 diabetes mellitus with diabetic neuropathic arthropathy: Secondary | ICD-10-CM | POA: Diagnosis not present

## 2017-03-17 DIAGNOSIS — E1140 Type 2 diabetes mellitus with diabetic neuropathy, unspecified: Secondary | ICD-10-CM | POA: Diagnosis not present

## 2017-03-19 NOTE — Progress Notes (Signed)
Subjective:   Patient ID: Glenn Herrera, male   DOB: 67 y.o.   MRN: [id number]   HPI Patient states him to wear the boot very much but it seems that the pain has resolved to a degree and he's got callus formations with long-term history of diabetes and neurological complications   ROS      Objective:  Physical Exam significant diminishment of neurological sensation upon testing with good circulatory flow with warmth still in the right foot with history of cellulitis which is most likely Charcot type condition. It appears to be relatively stable but he does require continued immobilization with plantar callus formation bilateral     Assessment:  At risk condition with Charcot foot structure and also plantar calluses with at risk neurological diabetic condition     Plan:  Reviewed again Charcot foot and the possibility someday surgery may be necessary but we will continue boot usage at the current time as best he can and will take him off work for several days. I debrided lesions bilateral with no iatrogenic bleeding and patient will be seen back to recheck

## 2017-04-15 ENCOUNTER — Ambulatory Visit: Payer: 59 | Admitting: Podiatry

## 2017-04-15 ENCOUNTER — Encounter: Payer: Self-pay | Admitting: Podiatry

## 2017-04-15 DIAGNOSIS — E1140 Type 2 diabetes mellitus with diabetic neuropathy, unspecified: Secondary | ICD-10-CM | POA: Diagnosis not present

## 2017-04-15 DIAGNOSIS — E1149 Type 2 diabetes mellitus with other diabetic neurological complication: Secondary | ICD-10-CM | POA: Diagnosis not present

## 2017-04-15 DIAGNOSIS — E11610 Type 2 diabetes mellitus with diabetic neuropathic arthropathy: Secondary | ICD-10-CM | POA: Diagnosis not present

## 2017-04-16 NOTE — Progress Notes (Signed)
Subjective:   Patient ID: Glenn Herrera, male   DOB: 67 y.o.   MRN: [id number]   HPI Patient states he has been doing okay with his right foot and states that it seems relatively stable and has been able to work   ROS      Objective:  Physical Exam  Patient has warmth in the right foot but no breakdown of tissue no proximal edema erythema no proximal erythema or streaking noted     Assessment:  Strong probability that this is Charcot foot type     Plan:  Reviewed Charcot foot type with patient and at this time it appears to be relatively stable we discussed possible bracing or other modalities and he understands at one point future may require surgery.  Patient will be seen back 2 months or earlier if any breakdown of tissue or any warmth or swelling were to get worse

## 2017-07-15 ENCOUNTER — Other Ambulatory Visit: Payer: Self-pay | Admitting: Podiatry

## 2017-07-15 ENCOUNTER — Ambulatory Visit (INDEPENDENT_AMBULATORY_CARE_PROVIDER_SITE_OTHER): Payer: 59

## 2017-07-15 ENCOUNTER — Encounter: Payer: Self-pay | Admitting: Podiatry

## 2017-07-15 ENCOUNTER — Ambulatory Visit: Payer: 59 | Admitting: Podiatry

## 2017-07-15 DIAGNOSIS — E11610 Type 2 diabetes mellitus with diabetic neuropathic arthropathy: Secondary | ICD-10-CM

## 2017-07-15 DIAGNOSIS — M79671 Pain in right foot: Secondary | ICD-10-CM | POA: Diagnosis not present

## 2017-07-16 NOTE — Progress Notes (Signed)
Subjective:   Patient ID: Glenn Herrera, male   DOB: 67 y.o.   MRN: [id number]   HPI Patient states he still has mild swelling in his right foot but is able to move a lot better and is working full-time which she needs to keep doing.  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  I do not recommend current treatment as he is not clinically symptomatic but I did explain that it continues to change on x-ray and ultimately may have to be dealt with but we will continue to watch it and evaluate how he is doing clinically and completely agrees with this and does not want any aggressive treatment  X-ray indicates there is further lucent changes around the metatarsal cuneiform joints and first metatarsocuneiform joint right with multiple signs of Charcot structure

## 2017-09-16 ENCOUNTER — Ambulatory Visit: Payer: 59 | Admitting: Podiatry

## 2017-09-16 ENCOUNTER — Encounter: Payer: Self-pay | Admitting: Podiatry

## 2017-09-16 DIAGNOSIS — E1140 Type 2 diabetes mellitus with diabetic neuropathy, unspecified: Secondary | ICD-10-CM

## 2017-09-16 DIAGNOSIS — L84 Corns and callosities: Secondary | ICD-10-CM

## 2017-09-16 DIAGNOSIS — E11610 Type 2 diabetes mellitus with diabetic neuropathic arthropathy: Secondary | ICD-10-CM

## 2017-09-16 DIAGNOSIS — E1149 Type 2 diabetes mellitus with other diabetic neurological complication: Secondary | ICD-10-CM | POA: Diagnosis not present

## 2017-09-20 NOTE — Progress Notes (Signed)
Subjective:   Patient ID: Glenn Herrera, male   DOB: 67 y.o.   MRN: [id number]   HPI Patient states my right foot seems to be doing okay and it does not seem to be getting worse and the left one I do have this large lesion on the side which has to be trimmed and I am worried that eventually developed infection   ROS      Objective:  Physical Exam  Neurovascular status is unchanged with patient found to have moderate collapse of medial longitudinal arch right which is remained stable and has not worsened.  Patient has some mild warmth in that foot but it is localized to this area and on the left there is significant keratotic lesion formation around the first metatarsal and left hallux with mild diminishment of neurological feeling secondary to long-term diabetes and again the concern of Charcot foot development     Assessment:  Charcot foot development right which appears to be stable and not worsening and plantar medial callus left hallux left first metatarsal secondary to increased pressure against this area     Plan:  H&P conditions reviewed and at this point I am relatively hopeful the right foot is stable.  For the left I did go ahead and I debrided all tissue and I did not note any drainage and I advised that this will be need to be done on a periodic basis.  Reappoint for [place] to reevaluate

## 2017-10-20 IMAGING — CT CT ABD-PELV W/ CM
2 of 5 series · 16 of 46 positions shown, 18 images · IV contrast (iopamidol)
Comparison: CT abdomen and pelvis March 13, 2013

ADDENDUM:
Acute findings discussed with and reconfirmed by Dr.AILEMA SHAHZAD on
02/05/2016 at [DATE].
CLINICAL DATA: RIGHT lower quadrant pain radiating to groin.
History of prostate cancer, diabetes, hypertension.

EXAM:
CT ABDOMEN AND PELVIS WITH CONTRAST
TECHNIQUE: Multidetector CT imaging of the abdomen and pelvis was performed
using the standard protocol following bolus administration of
intravenous contrast.
CONTRAST:  100mL S24RSY-XPP IOPAMIDOL (S24RSY-XPP) INJECTION 61%

[Series 2: abd/pel with · axial · 0.95mm/px · z∈[-516,-91]mm · 13 of 101 slices shown, 15 images]
[im 8/101  soft-tissue]
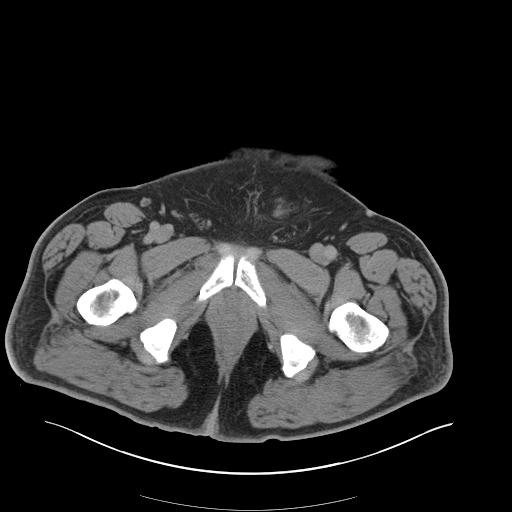
[im 8/101  bone]
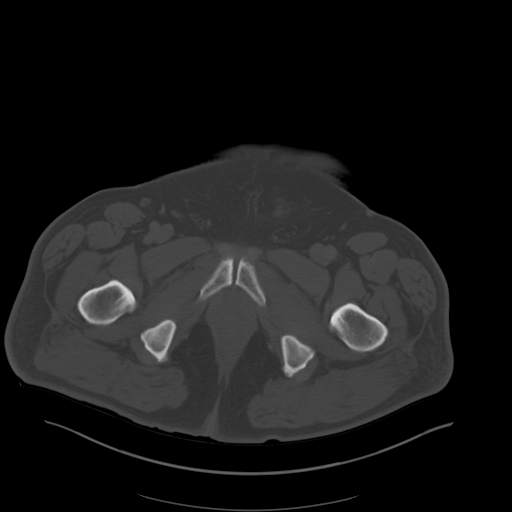
[im 15/101  soft-tissue]
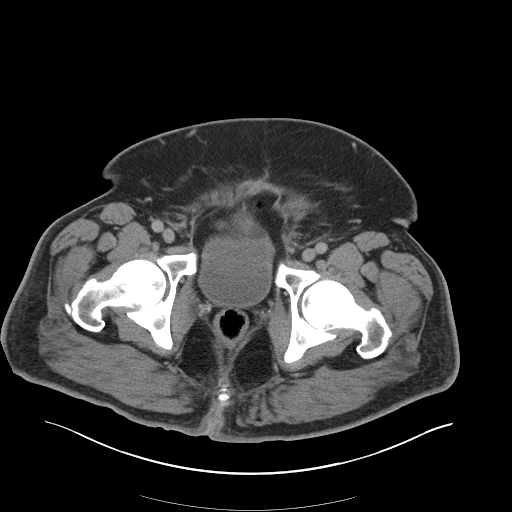
[im 22/101  soft-tissue]
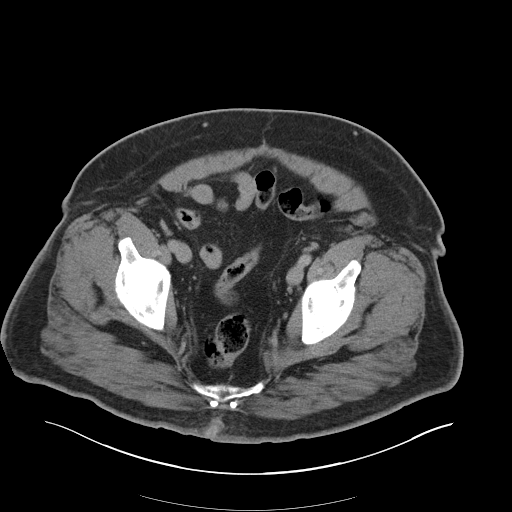
[im 29/101  soft-tissue]
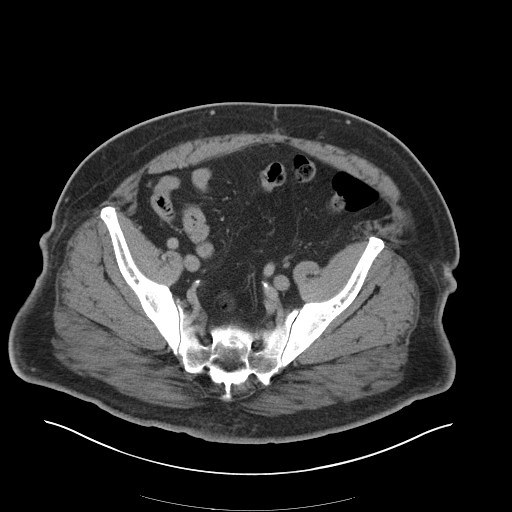
[im 36/101  soft-tissue]
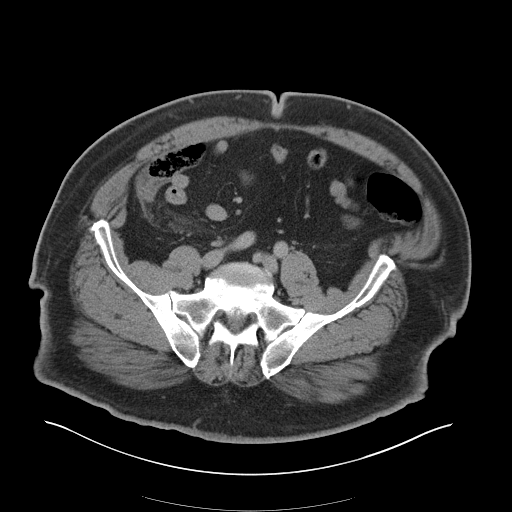
[im 43/101  soft-tissue]
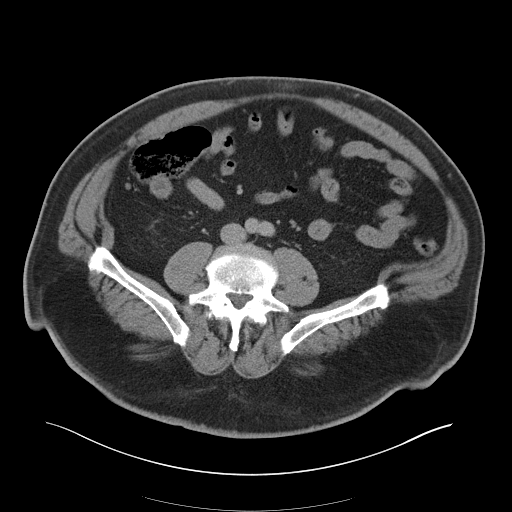
[im 51/101  soft-tissue]
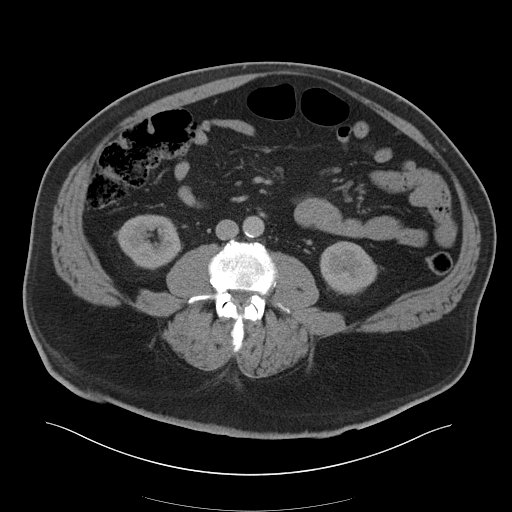
[im 58/101  soft-tissue]
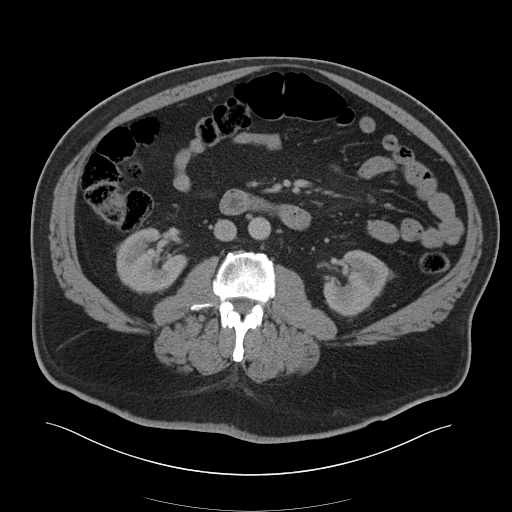
[im 65/101  soft-tissue]
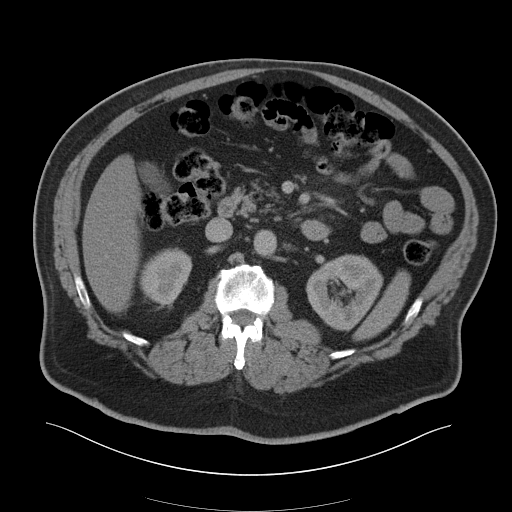
[im 65/101  bone]
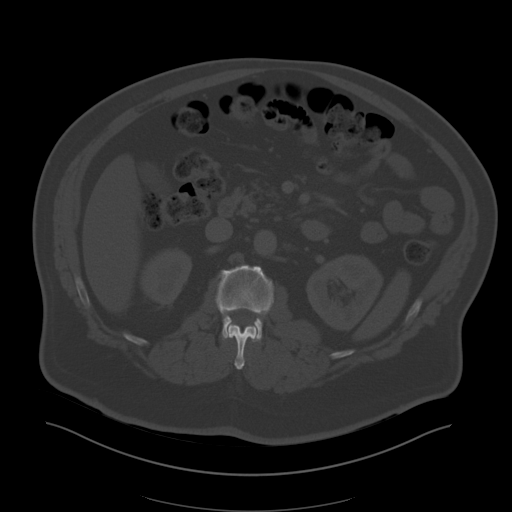
[im 72/101  soft-tissue]
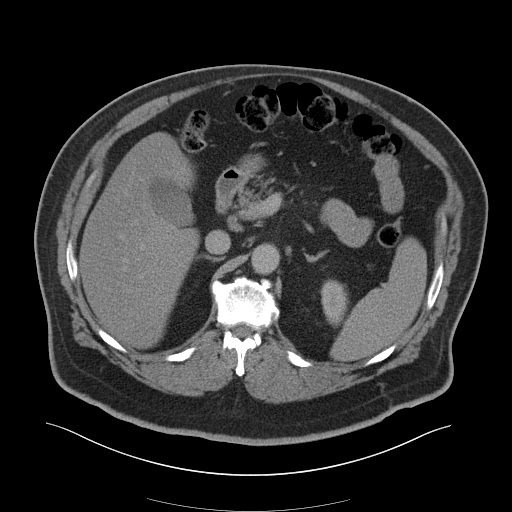
[im 79/101  soft-tissue]
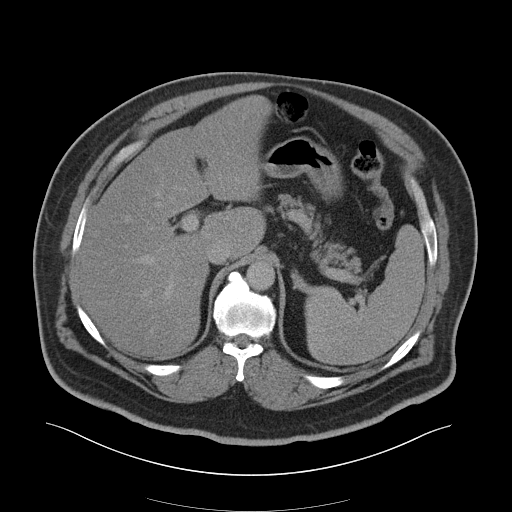
[im 86/101  soft-tissue]
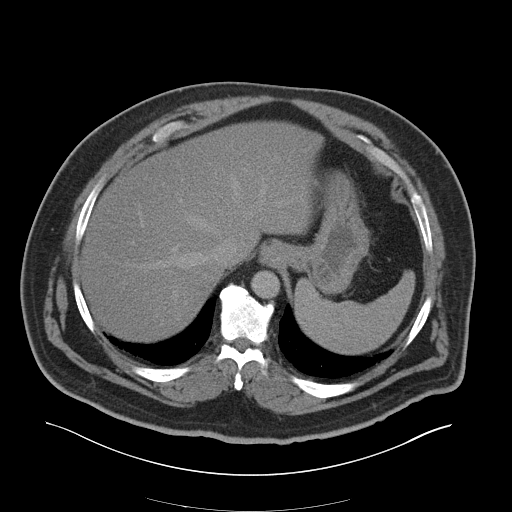
[im 93/101  soft-tissue]
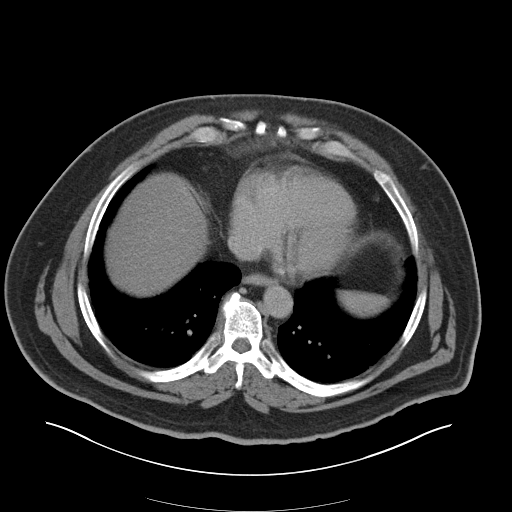

[Series 4: coronal a/|p · coronal · 0.93mm/px · 3 of 187 slices shown]
[im 63/187  soft-tissue]
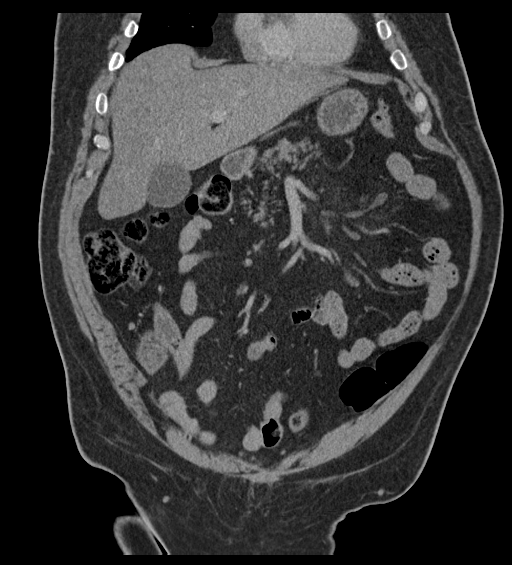
[im 83/187  soft-tissue]
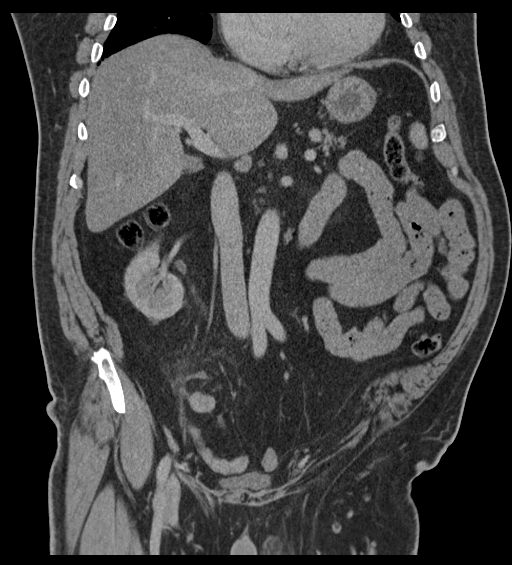
[im 104/187  soft-tissue]
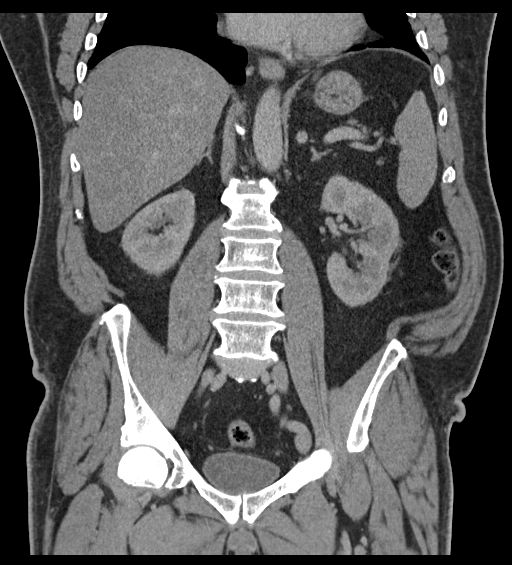

[16 of 46 positions shown; findings below may reference images not displayed]

FINDINGS: LOWER CHEST: Stable 6 mm RIGHT middle lobe and 3 mm lingular
pulmonary nodules. Heart size is normal. No pericardial effusions.

HEPATOBILIARY: The liver is diffusely hypodense compatible with
steatosis with mild focal fatty sparing about the gallbladder fossa.

PANCREAS: Normal.

SPLEEN: Normal.

ADRENALS/URINARY TRACT: Kidneys are orthotopic, demonstrating
symmetric enhancement. No nephrolithiasis, hydronephrosis or solid
renal masses. The unopacified ureters are normal in course and
caliber. Delayed imaging through the kidneys demonstrates symmetric
prompt contrast excretion within the proximal urinary collecting
system. Urinary bladder is partially distended and unremarkable.
Normal adrenal glands.

STOMACH/BOWEL: The stomach, small and large bowel are normal in
course and caliber without inflammatory changes. The appendix is
enlarged, 15 mm with mild periappendiceal inflammation, no
appendicolith.

VASCULAR/LYMPHATIC: Aortoiliac vessels are normal in course and
caliber, mild calcific atherosclerosis. No lymphadenopathy by CT
size criteria.

REPRODUCTIVE: Status post prostatectomy.

OTHER: No intraperitoneal free fluid or free air. Mild "misty"
mesentery with small lymph nodes is likely reactive.

MUSCULOSKELETAL: Inflammatory changes distal LEFT spermatic cord,
present on prior imaging. Small bilateral fat containing umbilical
hernias. Mild degenerative change of the hips. Grade 1 L4-5
anterolisthesis on degenerative basis with severe lower lumbar facet
arthropathy. Anterior pelvic wall scarring.
IMPRESSION: Acute appendicitis without complication.

Persistent, partially imaged inflammation of the distal spermatic
cord.

## 2017-11-26 DIAGNOSIS — E782 Mixed hyperlipidemia: Secondary | ICD-10-CM | POA: Diagnosis not present

## 2017-11-26 DIAGNOSIS — E1149 Type 2 diabetes mellitus with other diabetic neurological complication: Secondary | ICD-10-CM | POA: Diagnosis not present

## 2017-11-26 DIAGNOSIS — I10 Essential (primary) hypertension: Secondary | ICD-10-CM | POA: Diagnosis not present

## 2017-11-26 DIAGNOSIS — G629 Polyneuropathy, unspecified: Secondary | ICD-10-CM | POA: Diagnosis not present

## 2018-03-11 DIAGNOSIS — R319 Hematuria, unspecified: Secondary | ICD-10-CM | POA: Diagnosis not present

## 2018-03-11 DIAGNOSIS — R309 Painful micturition, unspecified: Secondary | ICD-10-CM | POA: Diagnosis not present

## 2018-06-07 ENCOUNTER — Encounter: Payer: Self-pay | Admitting: Podiatry

## 2018-06-07 ENCOUNTER — Ambulatory Visit (INDEPENDENT_AMBULATORY_CARE_PROVIDER_SITE_OTHER): Payer: Medicare Other

## 2018-06-07 ENCOUNTER — Other Ambulatory Visit: Payer: Self-pay | Admitting: Podiatry

## 2018-06-07 ENCOUNTER — Other Ambulatory Visit: Payer: Self-pay

## 2018-06-07 ENCOUNTER — Ambulatory Visit: Payer: Medicare Other | Admitting: Podiatry

## 2018-06-07 DIAGNOSIS — E1149 Type 2 diabetes mellitus with other diabetic neurological complication: Secondary | ICD-10-CM | POA: Insufficient documentation

## 2018-06-07 DIAGNOSIS — S90851A Superficial foreign body, right foot, initial encounter: Secondary | ICD-10-CM

## 2018-06-07 DIAGNOSIS — J309 Allergic rhinitis, unspecified: Secondary | ICD-10-CM | POA: Insufficient documentation

## 2018-06-07 DIAGNOSIS — L02611 Cutaneous abscess of right foot: Secondary | ICD-10-CM

## 2018-06-07 DIAGNOSIS — K219 Gastro-esophageal reflux disease without esophagitis: Secondary | ICD-10-CM | POA: Insufficient documentation

## 2018-06-07 DIAGNOSIS — R609 Edema, unspecified: Secondary | ICD-10-CM

## 2018-06-07 DIAGNOSIS — L03115 Cellulitis of right lower limb: Secondary | ICD-10-CM | POA: Diagnosis not present

## 2018-06-07 DIAGNOSIS — M109 Gout, unspecified: Secondary | ICD-10-CM | POA: Insufficient documentation

## 2018-06-07 DIAGNOSIS — G4733 Obstructive sleep apnea (adult) (pediatric): Secondary | ICD-10-CM | POA: Insufficient documentation

## 2018-06-07 DIAGNOSIS — G629 Polyneuropathy, unspecified: Secondary | ICD-10-CM | POA: Insufficient documentation

## 2018-06-07 DIAGNOSIS — Z8546 Personal history of malignant neoplasm of prostate: Secondary | ICD-10-CM | POA: Insufficient documentation

## 2018-06-07 MED ORDER — CLINDAMYCIN HCL 300 MG PO CAPS: 300.0000 mg | ORAL_CAPSULE | Freq: Three times a day (TID) | ORAL | 0 refills | Status: DC

## 2018-06-07 MED ORDER — CIPROFLOXACIN HCL 500 MG PO TABS: 500.0000 mg | ORAL_TABLET | Freq: Two times a day (BID) | ORAL | 0 refills | Status: DC

## 2018-06-07 NOTE — Patient Instructions (Signed)
Start antibiotics today Call your primary doctor to get a tetanus shot If there is any worsening infection you HAVE to go to the ER

## 2018-06-07 NOTE — Progress Notes (Signed)
Subjective: 68 year old male presents the office today for concerns of a possible infection of the right foot which is been ongoing for last 2 weeks.  He states that he came to the office now because he started to have increased pain.  He does not want to go to the hospital.  He denies any recent injury or trauma to his foot.  He states he had similar issue last year and he states that he was doing well up until last couple weeks when it started to [place].  Overall he feels well he denies any fevers, chills, nausea, vomiting.  He denies any calf pain, chest pain or shortness of breath.  Objective: AAO x3, NAD DP/PT pulses palpable bilaterally, CRT less than 3 seconds On the plantar aspect the midfoot is what appears to be puncture wound and a blister on the area.  There is edema and erythema to the plantar aspect of the foot going up the medial aspect of the foot.  There is no ascending cellulitis otherwise.  It goes up into just inferior to the medial malleoli.  The majority of tenderness is along the medial aspect the foot.  No fluctuation or crepitation like identified today.  There is no malodor.  See procedure note below. No open lesions or pre-ulcerative lesions.  No pain with calf compression, swelling, warmth, erythema  Assessment: Right foot foreign body, abscess, cellulitis  Plan: -All treatment options discussed with the patient including all alternatives, risks, complications.  -X-rays obtained reviewed.  There is a foreign object present in the soft tissue of the midfoot.  Charcot changes present.  No significant soft tissue edema identified. -Today I discussed with him removal of foreign object as well as exploration of the wound.  We discussed the procedure as well as postoperative course.  Alternatives, risks, complications discussed.  He refuses to go to the hospital.  He is to go to his primary care doctor today and get a tetanus update.  I cleaned the wound with alcohol.  At this  time a 15 blade scalpel was utilized to debride the wound as well as hyperkeratotic tissue.  There was a puncture wound present.  A small incision was made overlying this area and a foreign object was easily identified and was removed.  This appeared to be a metal staple.  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  The wound probes approximate 0.5 cm there is no probing to bone, undermining or tunneling.  There is no purulent second identified today and I took a wound culture.  I irrigated the wound today.  Betadine was applied.  Start clindamycin as well as ciprofloxacin.  If there is any worsening infection he must go immediately to the emergency room.  I want to check him later this week to make sure his infection is improving please recall if there is any worsening or changes.  He is to stay off of the splint encouraged elevation at all times.  Trula Slade DPM  -Patient encouraged to call the office with any questions, concerns, change in symptoms.

## 2018-06-08 ENCOUNTER — Telehealth: Payer: Self-pay | Admitting: *Deleted

## 2018-06-08 NOTE — Telephone Encounter (Signed)
I spoke with pt and he states the dressing came off in the bed and he put one on to go to work, but wanted to know if he could soak it. I told pt that often soaks were not recommended after surgery it could increase swelling, so it would be best if he cleaned the area with soap and water and rinsed well, patted dry covered with a light antibiotic ointment bandaid. Pt states understanding and will discuss with Dr. Jacqualyn Posey at his appt tomorrow.

## 2018-06-08 NOTE — Telephone Encounter (Signed)
Pt's wife, Katharine Look called states pt had a procedure and they would like to know if they could soak in epsom salt.

## 2018-06-09 ENCOUNTER — Other Ambulatory Visit: Payer: Self-pay

## 2018-06-09 ENCOUNTER — Encounter: Payer: Self-pay | Admitting: Podiatry

## 2018-06-09 ENCOUNTER — Ambulatory Visit: Payer: Medicare Other | Admitting: Podiatry

## 2018-06-09 VITALS — Temp 97.9°F

## 2018-06-09 DIAGNOSIS — L03115 Cellulitis of right lower limb: Secondary | ICD-10-CM

## 2018-06-09 DIAGNOSIS — S90851A Superficial foreign body, right foot, initial encounter: Secondary | ICD-10-CM

## 2018-06-10 LAB — CBC WITH DIFFERENTIAL/PLATELET
Absolute Monocytes: 496 cells/uL (ref 200–950)
Basophils Absolute: 41 cells/uL (ref 0–200)
Basophils Relative: 0.6 %
Eosinophils Absolute: 238 cells/uL (ref 15–500)
Eosinophils Relative: 3.5 %
HCT: 39.1 % (ref 38.5–50.0)
Hemoglobin: 13.2 g/dL (ref 13.2–17.1)
Lymphs Abs: 1394 cells/uL (ref 850–3900)
MCH: 27.4 pg (ref 27.0–33.0)
MCHC: 33.8 g/dL (ref 32.0–36.0)
MCV: 81.3 fL (ref 80.0–100.0)
MPV: 9.5 fL (ref 7.5–12.5)
Monocytes Relative: 7.3 %
Neutro Abs: 4631 cells/uL (ref 1500–7800)
Neutrophils Relative %: 68.1 %
Platelets: 204 10*3/uL (ref 140–400)
RBC: 4.81 10*6/uL (ref 4.20–5.80)
RDW: 13.4 % (ref 11.0–15.0)
Total Lymphocyte: 20.5 %
WBC: 6.8 10*3/uL (ref 3.8–10.8)

## 2018-06-10 LAB — WOUND CULTURE
MICRO NUMBER: 365606
SPECIMEN QUALITY: ADEQUATE

## 2018-06-10 LAB — C-REACTIVE PROTEIN: CRP: 53.7 mg/L — ABNORMAL HIGH (ref <8.0)

## 2018-06-10 LAB — SEDIMENTATION RATE: Sed Rate: 63 mm/h — ABNORMAL HIGH (ref 0–20)

## 2018-06-10 NOTE — Progress Notes (Signed)
Called and spoke with the patient's wife and stated that the culture did grow a staph infection and per Dr Jacqualyn Posey is on two antibiotics and is to continue both of them and it should be enough until he sees Dr Jacqualyn Posey and to call the office if any concerns or questions at [phone]. Lattie Haw

## 2018-06-13 ENCOUNTER — Other Ambulatory Visit: Payer: Self-pay

## 2018-06-13 ENCOUNTER — Ambulatory Visit: Payer: Medicare Other | Admitting: Podiatry

## 2018-06-13 ENCOUNTER — Telehealth: Payer: Self-pay | Admitting: *Deleted

## 2018-06-13 DIAGNOSIS — S90851A Superficial foreign body, right foot, initial encounter: Secondary | ICD-10-CM

## 2018-06-13 DIAGNOSIS — L03115 Cellulitis of right lower limb: Secondary | ICD-10-CM

## 2018-06-14 NOTE — Progress Notes (Signed)
Subjective: Glenn Herrera presents the office today for follow-up evaluation of infection of the right foot.  He is on the clindamycin and ciprofloxacin.  He states that overall he feels better.  He has not had any fevers or chills and denies any nausea or vomiting.  No chest pain, shortness of breath. The pain is mildly improved.  He states the redness is also improved.  Denies any drainage or pus coming from the wound.  He has no new concerns today.  Objective: AAO x3, NAD DP/PT pulses palpable bilaterally, CRT less than 3 seconds Wound present in the plantar aspect of the right midfoot.  I sharply debrided some hyperkeratotic tissue today there is no purulence.  Only small amount of bloody drainage.  There is still surrounding erythema although it has recessed some from the mark that I made on Wednesday.  There is no fluctuation crepitation.  There is no malodor.  No ascending cellulitis. No open lesions or pre-ulcerative lesions.  No pain with calf compression, swelling, warmth, erythema  Assessment: Right foot abscess, cellulitis with mild improvement  Plan: -All treatment options discussed with the patient including all alternatives, risks, complications.  -Overall appears the infection is improving.  Continue clindamycin and ciprofloxacin.  Await final culture results.  Order blood work today as well including CBC, ESR, CRP.  Offloading shoe.  Monitoring signs or symptoms of worsening infection must report immediately to the emergency department. -Patient encouraged to call the office with any questions, concerns, change in symptoms.  Trula Slade DPM

## 2018-06-15 ENCOUNTER — Telehealth: Payer: Self-pay | Admitting: Podiatry

## 2018-06-15 MED ORDER — CLINDAMYCIN HCL 300 MG PO CAPS: 300.0000 mg | ORAL_CAPSULE | Freq: Three times a day (TID) | ORAL | 0 refills | Status: DC

## 2018-06-15 MED ORDER — CIPROFLOXACIN HCL 500 MG PO TABS: 500.0000 mg | ORAL_TABLET | Freq: Two times a day (BID) | ORAL | 0 refills | Status: DC

## 2018-06-15 NOTE — Telephone Encounter (Signed)
I called pt he states it is some better, not as sore when walking is still swollen but it stays that way. Pt states the foot is still a little red and about as warm as it usually is. I told pt I would refill the cipro and cleocin.

## 2018-06-15 NOTE — Addendum Note (Signed)
Addended by: Harriett Sine D on: 06/15/2018 11:24 AM   Modules accepted: Orders

## 2018-06-15 NOTE — Telephone Encounter (Signed)
Pt called requesting a refill on both of his prescriptions, Ciprofloxacin and Clindamycin.

## 2018-06-16 NOTE — Progress Notes (Signed)
Subjective: Jaquis presents the office today for follow-up evaluation of infection of the right foot.  Overall he states he is doing better.  The pain is improved.  He stopped with the clindamycin and ciprofloxacin.  He denies any drainage or pus.  The redness is also much improved.  He has not returned to work.  Denies any fevers, chills, nausea, vomiting.  No calf pain, chest pain, shortness of breath.  Objective: AAO x3, NAD DP/PT pulses palpable bilaterally, CRT less than 3 seconds Hyperkeratotic tissue present in the plantar aspect of the right midfoot.  Upon debridement superficial wound present there is no probing, undermining or tunneling.  There is substantially improved edema and erythema to the foot.  There is no drainage or pus there is no fluctuation or crepitation any malodor.  No ascending cellulitis.  No open lesions or pre-ulcerative lesions.  No pain with calf compression, swelling, warmth, erythema  Assessment: Right foot abscess, cellulitis with improvement  Plan: -All treatment options discussed with the patient including all alternatives, risks, complications.  -Overall appears the infection is improving.  Finish clindamycin and ciprofloxacin.  Once finished elevate continue clindamycin if needed.  Discussed wound culture results. -Continue small meta Betadine wet-to-dry to the area daily. -Monitor for any clinical signs or symptoms of worsening infection and directed to call the office immediately should any occur or go to the ER.  RTC 1 week or sooner if needed  Trula Slade DPM

## 2018-06-20 ENCOUNTER — Encounter: Payer: Self-pay | Admitting: Podiatry

## 2018-06-20 ENCOUNTER — Ambulatory Visit: Payer: Medicare Other | Admitting: Podiatry

## 2018-06-20 ENCOUNTER — Other Ambulatory Visit: Payer: Self-pay

## 2018-06-20 VITALS — Temp 97.5°F

## 2018-06-20 DIAGNOSIS — L02611 Cutaneous abscess of right foot: Secondary | ICD-10-CM | POA: Diagnosis not present

## 2018-06-20 DIAGNOSIS — E11610 Type 2 diabetes mellitus with diabetic neuropathic arthropathy: Secondary | ICD-10-CM | POA: Diagnosis not present

## 2018-06-20 DIAGNOSIS — L03115 Cellulitis of right lower limb: Secondary | ICD-10-CM

## 2018-06-22 NOTE — Progress Notes (Signed)
Subjective: Nathanel presents the office today for follow-up evaluation of infection of the right foot.  He states he is doing much better.  He states the redness is improved and he has very minimal to no discomfort.  He is ready to back to work.  He denies any drainage or pus.  He is still on the antibiotics.  He denies any fevers, chills, nausea, vomiting.  No calf pain, chest pain, shortness of breath.  Objective: AAO x3, NAD DP/PT pulses palpable bilaterally, CRT less than 3 seconds Hyperkeratotic tissue present in the plantar aspect of the right midfoot.  Upon debridement there is no underlying ulceration, drainage or any signs of infection.  The erythema has resolved.  There is no red streaks.  Is no drainage or pus identified from the area today.  No fluctuation crepitation is no malodor. No open lesions or pre-ulcerative lesions.  No pain with calf compression, swelling, warmth, erythema  Assessment: Right foot abscess, cellulitis with improvement  Plan: -All treatment options discussed with the patient including all alternatives, risks, complications.  -I debrided hyperkeratotic tissue today with any complications or bleeding to reveal that the wound is healed.  I did keep a bandage on the area daily.  He is eager to go back to work.  I am hesitant for him to wear steel toed shoes.  He states that he can wear a regular boot that he has been wearing at home and this is been doing well he can wear steel toed covering.  Finish the course of antibiotics.  Note provided for work to work.  Monitor for any signs or symptoms of infection or skin breakdown and to call the office immediately should any occur.  Return in about 2 weeks (around 07/04/2018). Also for molding of inserts.  Given his history of infection, open wounds with Charcot he would benefit.   Trula Slade DPM

## 2018-07-05 ENCOUNTER — Ambulatory Visit: Payer: Medicare Other | Admitting: Podiatry

## 2018-07-05 ENCOUNTER — Other Ambulatory Visit: Payer: Self-pay

## 2018-07-05 ENCOUNTER — Encounter: Payer: Self-pay | Admitting: Podiatry

## 2018-07-05 ENCOUNTER — Ambulatory Visit: Payer: Medicare Other | Admitting: Orthotics

## 2018-07-05 DIAGNOSIS — E11610 Type 2 diabetes mellitus with diabetic neuropathic arthropathy: Secondary | ICD-10-CM

## 2018-07-05 DIAGNOSIS — S90851A Superficial foreign body, right foot, initial encounter: Secondary | ICD-10-CM

## 2018-07-05 DIAGNOSIS — L84 Corns and callosities: Secondary | ICD-10-CM

## 2018-07-05 DIAGNOSIS — E1149 Type 2 diabetes mellitus with other diabetic neurological complication: Secondary | ICD-10-CM | POA: Diagnosis not present

## 2018-07-05 DIAGNOSIS — L02611 Cutaneous abscess of right foot: Secondary | ICD-10-CM

## 2018-07-05 DIAGNOSIS — Z872 Personal history of diseases of the skin and subcutaneous tissue: Secondary | ICD-10-CM | POA: Diagnosis not present

## 2018-07-05 DIAGNOSIS — E1140 Type 2 diabetes mellitus with diabetic neuropathy, unspecified: Secondary | ICD-10-CM | POA: Diagnosis not present

## 2018-07-05 DIAGNOSIS — L03115 Cellulitis of right lower limb: Secondary | ICD-10-CM

## 2018-07-05 NOTE — Progress Notes (Signed)
Patient came into today to be cast for Custom Foot Orthotics. Upon recommendation of Dr. Jacqualyn Posey Patient presents with diabetic  Ulcer/fat layer exposed Goals are offlloading ulcer. Plan vendor Angela Cox  Dr Reece Agar to drop charges Patient signed ABM

## 2018-07-07 NOTE — Progress Notes (Signed)
Subjective: Glenn Herrera presents the office today for follow-up evaluation of infection of the right foot.  He states that he is doing well and is back to work.  He has had some dry skin along the area.  Denies any drainage or pus or any redness.  He states that this is the best the swelling is been a long time.  He also presents with a get measured for orthotics to help avoid pressure and any further skin breakdown. He denies any fevers, chills, nausea, vomiting.  No calf pain, chest pain, shortness of breath.  Objective: AAO x3, NAD DP/PT pulses palpable bilaterally, CRT less than 3 seconds Hyperkeratotic tissue present in the plantar aspect of the right midfoot.  Upon debridement there is no underlying ulceration, drainage or any signs of infection.  The infection has resolved.  There is no surrounding erythema, ascending cellulitis.  No fluctuation crepitation or any malodor.  Charcot changes are present in the foot. No open lesions or pre-ulcerative lesions.  No pain with calf compression, swelling, warmth, erythema  Assessment: Right foot pre-ulcerative callus, Charcot with resolved infection  Plan: -All treatment options discussed with the patient including all alternatives, risks, complications.  -I debrided hyperkeratotic tissue today with any complications or bleeding to reveal that the wound is healed.  Recommend moisturizer to the area daily.  Discussed importance daily foot inspection.  Today he was measured by Liliane Channel for molding of orthotics.  Follow-up in 3 weeks to pick up orthotics or sooner if needed  Trula Slade DPM

## 2018-07-07 NOTE — Telephone Encounter (Signed)
Entered in error

## 2018-08-02 ENCOUNTER — Other Ambulatory Visit: Payer: Medicare Other | Admitting: Orthotics

## 2018-08-02 ENCOUNTER — Ambulatory Visit: Payer: Medicare Other | Admitting: Podiatry

## 2018-08-02 DIAGNOSIS — H26493 Other secondary cataract, bilateral: Secondary | ICD-10-CM | POA: Diagnosis not present

## 2018-08-02 DIAGNOSIS — E119 Type 2 diabetes mellitus without complications: Secondary | ICD-10-CM | POA: Diagnosis not present

## 2018-08-08 ENCOUNTER — Other Ambulatory Visit: Payer: Self-pay

## 2018-08-08 ENCOUNTER — Encounter: Payer: Self-pay | Admitting: Podiatry

## 2018-08-08 ENCOUNTER — Ambulatory Visit: Payer: Medicare Other | Admitting: Podiatry

## 2018-08-08 ENCOUNTER — Ambulatory Visit: Payer: Medicare Other | Admitting: Orthotics

## 2018-08-08 VITALS — Temp 97.2°F

## 2018-08-08 DIAGNOSIS — L84 Corns and callosities: Secondary | ICD-10-CM

## 2018-08-08 DIAGNOSIS — E11610 Type 2 diabetes mellitus with diabetic neuropathic arthropathy: Secondary | ICD-10-CM

## 2018-08-08 DIAGNOSIS — E1149 Type 2 diabetes mellitus with other diabetic neurological complication: Secondary | ICD-10-CM | POA: Diagnosis not present

## 2018-08-08 DIAGNOSIS — E1140 Type 2 diabetes mellitus with diabetic neuropathy, unspecified: Secondary | ICD-10-CM

## 2018-08-08 DIAGNOSIS — Z872 Personal history of diseases of the skin and subcutaneous tissue: Secondary | ICD-10-CM

## 2018-08-08 NOTE — Progress Notes (Signed)
Patient came in today to pick up custom made foot orthotics.  The goals were accomplished and the patient reported no dissatisfaction with said orthotics.  Patient was advised of breakin period and how to report any issues. 

## 2018-08-17 NOTE — Progress Notes (Signed)
Subjective: 68 year old male presents the office today for follow evaluation of pre-ulcerative calluses, Charcot on his right foot.  This is been doing well.  He also is going to be seen by Liliane Channel pick up his orthotics today.  He has no new concerns he denies any increase in swelling or redness or any other issues. Denies any systemic complaints such as fevers, chills, nausea, vomiting. No acute changes since last appointment, and no other complaints at this time.   Objective: AAO x3, NAD DP/PT pulses palpable bilaterally, CRT less than 3 seconds Protective sensation decreased with Simms Weinstein monofilament Chronic Charcot deformities present.  Hyperkeratotic lesion on the midfoot on the right side laterally.  Upon debridement there is no underlying ulceration, drainage or any clinical signs of infection.  There is no erythema or warmth.  No open lesions or pre-ulcerative lesions.  No pain with calf compression, swelling, warmth, erythema  Assessment: Chronic Charcot foot with pre-ulcerative callus  Plan: -All treatment options discussed with the patient including all alternatives, risks, complications.  -Tabulated the hyperkeratotic lesion without any complications or bleeding.  There is no signs of infection or open sores but monitor closely.  Orthotics were dispensed today by Liliane Channel.  -Monitor for any clinical signs or symptoms of infection and directed to call the office immediately should any occur or go to the ER. -Patient encouraged to call the office with any questions, concerns, change in symptoms.   Trula Slade DPM

## 2018-09-02 DIAGNOSIS — E1149 Type 2 diabetes mellitus with other diabetic neurological complication: Secondary | ICD-10-CM | POA: Diagnosis not present

## 2018-09-02 DIAGNOSIS — I10 Essential (primary) hypertension: Secondary | ICD-10-CM | POA: Diagnosis not present

## 2018-09-02 DIAGNOSIS — Z0000 Encounter for general adult medical examination without abnormal findings: Secondary | ICD-10-CM | POA: Diagnosis not present

## 2018-09-02 DIAGNOSIS — E782 Mixed hyperlipidemia: Secondary | ICD-10-CM | POA: Diagnosis not present

## 2018-09-06 ENCOUNTER — Ambulatory Visit: Payer: Medicare Other | Admitting: Podiatry

## 2018-09-06 ENCOUNTER — Other Ambulatory Visit: Payer: Self-pay

## 2018-09-06 ENCOUNTER — Encounter: Payer: Self-pay | Admitting: Podiatry

## 2018-09-06 VITALS — Temp 96.7°F

## 2018-09-06 DIAGNOSIS — E1149 Type 2 diabetes mellitus with other diabetic neurological complication: Secondary | ICD-10-CM | POA: Diagnosis not present

## 2018-09-06 DIAGNOSIS — L84 Corns and callosities: Secondary | ICD-10-CM | POA: Diagnosis not present

## 2018-09-06 DIAGNOSIS — E1140 Type 2 diabetes mellitus with diabetic neuropathy, unspecified: Secondary | ICD-10-CM

## 2018-09-06 DIAGNOSIS — E11610 Type 2 diabetes mellitus with diabetic neuropathic arthropathy: Secondary | ICD-10-CM

## 2018-09-06 NOTE — Progress Notes (Signed)
Subjective: 68 year old male presents the office today for follow evaluation of pre-ulcerative calluses, Charcot on his right foot.  Overall he states that he is doing well without new inserts.  The last dose was noted to be concentration top of his foot.  He denies any new open sores.  He has no other concerns.  Denies any systemic complaints such as fevers, chills, nausea, vomiting. No acute changes since last appointment, and no other complaints at this time.   Objective: AAO x3, NAD DP/PT pulses palpable bilaterally, CRT less than 3 seconds Protective sensation decreased with Simms Weinstein monofilament Chronic Charcot deformities present.  Hyperkeratotic lesion on the midfoot on the right side however upon debridement there is no ongoing ulceration drainage or signs of infection.  Irritation of the dorsal aspect of the left side worse than the right from where the toes of the redness that she has but there is no skin breakdown.  Irritation is slightly erythematous was blanchable and there is no increase in warmth, fluctuation or crepitation or any ascending cellulitis. No open lesions or pre-ulcerative lesions.  No pain with calf compression, swelling, warmth, erythema  Assessment: Chronic Charcot foot with pre-ulcerative callus  Plan: -All treatment options discussed with the patient including all alternatives, risks, complications.  -Sharply debrided the hyperkeratotic lesion without any complications or bleeding.  There is no signs of infection or open sores but monitor closely.  Orthotics were modified today with [employer]. -Monitor for any clinical signs or symptoms of infection and directed to call the office immediately should any occur or go to the ER. -Patient encouraged to call the office with any questions, concerns, change in symptoms.   Trula Slade DPM

## 2018-09-20 ENCOUNTER — Other Ambulatory Visit: Payer: Self-pay

## 2018-09-20 ENCOUNTER — Other Ambulatory Visit: Payer: Medicare Other | Admitting: Orthotics

## 2018-10-04 ENCOUNTER — Other Ambulatory Visit: Payer: Self-pay

## 2018-10-04 ENCOUNTER — Ambulatory Visit: Payer: Medicare Other | Admitting: Podiatry

## 2018-10-04 ENCOUNTER — Encounter: Payer: Self-pay | Admitting: Podiatry

## 2018-10-04 VITALS — Temp 96.9°F

## 2018-10-04 DIAGNOSIS — E1149 Type 2 diabetes mellitus with other diabetic neurological complication: Secondary | ICD-10-CM

## 2018-10-04 DIAGNOSIS — L84 Corns and callosities: Secondary | ICD-10-CM | POA: Diagnosis not present

## 2018-10-04 DIAGNOSIS — E11610 Type 2 diabetes mellitus with diabetic neuropathic arthropathy: Secondary | ICD-10-CM | POA: Diagnosis not present

## 2018-10-04 DIAGNOSIS — E1140 Type 2 diabetes mellitus with diabetic neuropathy, unspecified: Secondary | ICD-10-CM | POA: Diagnosis not present

## 2018-10-12 NOTE — Progress Notes (Signed)
Subjective: 68 year old male presents the office today for concerns of calluses that need to have trimmed on both feet.  He denies any open sores.  Comfortable to his toes.  Area on the right foot on the plantar aspect of the treated previously has been doing very well.  He states that A1c is high and his primary been on different medication. Denies any systemic complaints such as fevers, chills, nausea, vomiting. No acute changes since last appointment, and no other complaints at this time.   Objective: AAO x3, NAD DP/PT pulses palpable bilaterally, CRT less than 3 seconds Charcot changes present.  On the midfoot on the right side there is no significant hyperkeratotic tissue no open lesions.  The hyperkeratotic tissue to bilateral hallux as well as the hospitalist.  On debridement there is no ongoing ulceration drainage or signs of infection.  There is some dried blood in the calluses.  No open lesions or pre-ulcerative lesions.  No pain with calf compression, swelling, warmth, erythema  Assessment: Pre-ulcerative callus  Plan: -All treatment options discussed with the patient including all alternatives, risks, complications.  -Lesions were sharply treated times without any complications or bleeding.  Discussed moisturizer daily.  Continue orthotics and supportive shoes.  Not wearing new orthotics today.   -Also discussed importance of glucose control. -Discussed with him importance of daily foot inspection offloading due to the Charcot.  Discussed the progression of this today as well. -Patient encouraged to call the office with any questions, concerns, change in symptoms.   Trula Slade DPM

## 2018-11-21 ENCOUNTER — Ambulatory Visit (INDEPENDENT_AMBULATORY_CARE_PROVIDER_SITE_OTHER): Payer: Medicare Other | Admitting: Podiatry

## 2018-11-21 ENCOUNTER — Encounter: Payer: Self-pay | Admitting: Podiatry

## 2018-11-21 ENCOUNTER — Other Ambulatory Visit: Payer: Self-pay

## 2018-11-21 DIAGNOSIS — L03116 Cellulitis of left lower limb: Secondary | ICD-10-CM

## 2018-11-21 DIAGNOSIS — L97521 Non-pressure chronic ulcer of other part of left foot limited to breakdown of skin: Secondary | ICD-10-CM

## 2018-11-21 DIAGNOSIS — L84 Corns and callosities: Secondary | ICD-10-CM | POA: Diagnosis not present

## 2018-11-21 DIAGNOSIS — E11610 Type 2 diabetes mellitus with diabetic neuropathic arthropathy: Secondary | ICD-10-CM | POA: Diagnosis not present

## 2018-11-21 MED ORDER — CEPHALEXIN 500 MG PO CAPS: 500.0000 mg | ORAL_CAPSULE | Freq: Three times a day (TID) | ORAL | 0 refills | Status: DC

## 2018-11-21 NOTE — Progress Notes (Signed)
Subjective: 68 year old male presents the office today for the evaluation of the ulcer of the left second toe.  States the area is doing well however he has new ulcerations on the second, third, fourth toes.  Describes his toes [place] in [place] last week.  He did not notice until later that night.  Denies any drainage or pus but mild redness.  No red streaks.  No pain.  The right foot is been doing well.  Hammertoe contractures present.  Denies any systemic complaints such as fevers, chills, nausea, vomiting. No acute changes since last appointment, and no other complaints at this time.   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  Minimal hyperkeratotic tissue on the plantar aspect the right foot.  No ongoing ulceration.  Hyperkeratotic tissue of the right dorsal second toe.  No ongoing ulceration.  Superficial abrasions, ulcerations present dorsal aspect the second, third, fourth toe on the left side.  Mild surrounding erythema but no ascending cellulitis.  No fluctuation crepitation.  No open lesions or pre-ulcerative lesions.  No pain with calf compression, swelling, warmth, erythema  Assessment: Wounds left foot with localized erythema; pre-ulcerative callus  Plan: -All treatment options discussed with the patient including all alternatives, risks, complications.  -Debrided the pre-ulcerative callus on the right second toe without any complications or bleeding.  Continue offloading.  Continue antibiotic ointment dressing changes on the left side of the abrasions.  Prescribed Keflex.  Monitor for any signs or symptoms of worsening infection. -Patient encouraged to call the office with any questions, concerns, change in symptoms.   Return in about 2 weeks (around 12/05/2018).  Trula Slade DPM

## 2018-12-06 DIAGNOSIS — E1149 Type 2 diabetes mellitus with other diabetic neurological complication: Secondary | ICD-10-CM | POA: Diagnosis not present

## 2018-12-15 ENCOUNTER — Other Ambulatory Visit: Payer: Self-pay

## 2018-12-15 ENCOUNTER — Ambulatory Visit: Payer: Medicare Other | Admitting: Podiatry

## 2018-12-15 DIAGNOSIS — L84 Corns and callosities: Secondary | ICD-10-CM | POA: Diagnosis not present

## 2018-12-15 DIAGNOSIS — E11610 Type 2 diabetes mellitus with diabetic neuropathic arthropathy: Secondary | ICD-10-CM | POA: Diagnosis not present

## 2018-12-27 DIAGNOSIS — E1149 Type 2 diabetes mellitus with other diabetic neurological complication: Secondary | ICD-10-CM | POA: Diagnosis not present

## 2018-12-27 DIAGNOSIS — D692 Other nonthrombocytopenic purpura: Secondary | ICD-10-CM | POA: Diagnosis not present

## 2018-12-27 DIAGNOSIS — M5416 Radiculopathy, lumbar region: Secondary | ICD-10-CM | POA: Diagnosis not present

## 2018-12-27 DIAGNOSIS — M545 Low back pain: Secondary | ICD-10-CM | POA: Diagnosis not present

## 2019-01-01 NOTE — Progress Notes (Signed)
Subjective: 67 year old male presents the office today for follow-up evaluation of a wound on the left foot as well as or preulcerative calluses.  He has been orthotics are still causing the toes to hurt.  He states that otherwise the wounds are doing well.  At times his pain level 7/10.  Is been keeping Neosporin on the wounds as well as a Band-Aid.  Denies any drainage or pus. Denies any systemic complaints such as fevers, chills, nausea, vomiting. No acute changes since last appointment, and no other complaints at this time.   A1c 7.8  Objective: AAO x3, NAD DP/PT pulses palpable bilaterally, CRT less than 3 seconds Protective sensation decreased with Simms Weinstein monofilament Charcot changes present the right foot.  No reoccurrence of the hyperkeratotic lesion on the plantar midfoot.  Hyperkeratotic lesion on the dorsal right second toe.  Also scabs present in the dorsal second and third PIPJ of the left foot.  Thick hyperkeratotic lesion medial aspect of the hallux on the left side.  No underlying ulceration or signs of infection.  No edema, erythema there is no drainage or pus.  No fluctuation crepitation.  There is no malodor.  No pain with calf compression, swelling, warmth, erythema  Assessment: Preulcerative lesions bilaterally  Plan: -All treatment options discussed with the patient including all alternatives, risks, complications.  -Continue offloading.  Keep a small amount of antibiotic ointment and a dressing daily.  I had Liliane Channel evaluate him today for modifications of the diabetic inserts. -He completed a course of antibiotics.  We will hold any further antibiotics but continue to monitor for any signs or symptoms of infection. -Patient encouraged to call the office with any questions, concerns, change in symptoms.   Trula Slade DPM

## 2019-01-05 ENCOUNTER — Ambulatory Visit: Payer: Medicare Other | Admitting: Orthotics

## 2019-01-05 ENCOUNTER — Other Ambulatory Visit: Payer: Self-pay

## 2019-01-05 DIAGNOSIS — L03116 Cellulitis of left lower limb: Secondary | ICD-10-CM

## 2019-01-05 DIAGNOSIS — L84 Corns and callosities: Secondary | ICD-10-CM

## 2019-01-05 DIAGNOSIS — Z872 Personal history of diseases of the skin and subcutaneous tissue: Secondary | ICD-10-CM

## 2019-01-05 DIAGNOSIS — E11610 Type 2 diabetes mellitus with diabetic neuropathic arthropathy: Secondary | ICD-10-CM

## 2019-01-05 NOTE — Progress Notes (Signed)
Patient came in today to pick up custom made foot orthotics.  The goals were accomplished and the patient reported no dissatisfaction with said orthotics.  Patient was advised of breakin period and how to report any issues. 

## 2019-01-16 ENCOUNTER — Ambulatory Visit: Payer: Medicare Other | Admitting: Podiatry

## 2019-01-16 ENCOUNTER — Encounter: Payer: Self-pay | Admitting: Podiatry

## 2019-01-16 ENCOUNTER — Other Ambulatory Visit: Payer: Self-pay

## 2019-01-16 DIAGNOSIS — E1149 Type 2 diabetes mellitus with other diabetic neurological complication: Secondary | ICD-10-CM

## 2019-01-16 DIAGNOSIS — L97521 Non-pressure chronic ulcer of other part of left foot limited to breakdown of skin: Secondary | ICD-10-CM

## 2019-01-16 DIAGNOSIS — E1140 Type 2 diabetes mellitus with diabetic neuropathy, unspecified: Secondary | ICD-10-CM

## 2019-01-16 NOTE — Patient Instructions (Signed)
Monitor for any signs/symptoms of infection. If you notice any increase in redness, drainage, swelling, fevers, chills or any other symptom let me know. Call the office immediately if any occur or go directly to the emergency room. Call with any questions/concerns.

## 2019-01-17 NOTE — Progress Notes (Addendum)
Subjective: 68 year old male presents the office today for concerns and follow-up evaluation of preulcerative area in the left hallux.  He states that he has been getting some dried blood on the area and the skin has been coming off almost appears to be a blister.  Denies any drainage or pus or any increase in swelling or any redness.  No pain.  He reports he had stopped his feet he has peeled some of the loose skin off himself.  He says the other lesions of all healed. Denies any systemic complaints such as fevers, chills, nausea, vomiting. No acute changes since last appointment, and no other complaints at this time.   Objective: AAO x3, NAD DP/PT pulses palpable bilaterally, CRT less than 3 seconds Thick hyperkeratotic tissue on the medial aspect left hallux and there is dried what appears to be an old blister.  Upon debridement there is a superficial area of skin breakdown there is no drainage or pus or any fluctuation or crepitation and there is no malodor.  Mild surrounding erythema but this is more inflammation as opposed to infection.  There is no warmth of the foot. No pain with calf compression, swelling, warmth, erythema      Assessment: Superficial wound left hallux  Plan: -All treatment options discussed with the patient including all alternatives, risks, complications.  -The patient about his prior to debridement.  Debrided the hyperkeratotic tissue today without any complications or bleeding.  Recommend a small amount of Betadine to the area daily.  Dispensed a surgical shoe for offloading.  Note was provided to wear this at work if able.  Elevation. -Monitor for any clinical signs or symptoms of infection and directed to call the office immediately should any occur or go to the ER. -Patient encouraged to call the office with any questions, concerns, change in symptoms.   Trula Slade DPM

## 2019-01-30 ENCOUNTER — Encounter: Payer: Self-pay | Admitting: Podiatry

## 2019-01-30 ENCOUNTER — Other Ambulatory Visit: Payer: Self-pay

## 2019-01-30 ENCOUNTER — Ambulatory Visit: Payer: Medicare Other | Admitting: Podiatry

## 2019-01-30 DIAGNOSIS — L97521 Non-pressure chronic ulcer of other part of left foot limited to breakdown of skin: Secondary | ICD-10-CM

## 2019-01-30 DIAGNOSIS — E1140 Type 2 diabetes mellitus with diabetic neuropathy, unspecified: Secondary | ICD-10-CM

## 2019-01-30 DIAGNOSIS — E1149 Type 2 diabetes mellitus with other diabetic neurological complication: Secondary | ICD-10-CM

## 2019-02-06 NOTE — Progress Notes (Signed)
Subjective: 68 year old male presents the office today for follow-up evaluation of a wound on the left big toe.  He states overall he is doing better.  He denies any drainage or pus coming the toe denies any swelling or redness.  No pain. Denies any systemic complaints such as fevers, chills, nausea, vomiting. No acute changes since last appointment, and no other complaints at this time.   Objective: AAO x3, NAD DP/PT pulses palpable bilaterally, CRT less than 3 seconds Hyperkeratotic lesion left medial hallux.  Upon debridement the wound appears to be healed the area is preulcerative.  No drainage or pus.  No erythema or cellulitis.  No fluctuation crepitation. No open lesions or pre-ulcerative lesions.  No pain with calf compression, swelling, warmth, erythema  Assessment: 68 year old male preulcerative area left hallux with improvement   Plan: -All treatment options discussed with the patient including all alternatives, risks, complications.  -Lesion was debrided to the any complications or bleeding.  Continue offloading daily.  Antibiotic ointment and dressing. -Monitor for any clinical signs or symptoms of infection and directed to call the office immediately should any occur or go to the ER. -Patient encouraged to call the office with any questions, concerns, change in symptoms.   Trula Slade DPM

## 2019-02-13 ENCOUNTER — Ambulatory Visit: Payer: Medicare Other | Admitting: Podiatry

## 2019-02-14 DIAGNOSIS — U071 COVID-19: Secondary | ICD-10-CM | POA: Diagnosis not present

## 2019-02-24 DIAGNOSIS — E1149 Type 2 diabetes mellitus with other diabetic neurological complication: Secondary | ICD-10-CM | POA: Diagnosis not present

## 2019-02-24 DIAGNOSIS — G629 Polyneuropathy, unspecified: Secondary | ICD-10-CM | POA: Diagnosis not present

## 2019-02-24 DIAGNOSIS — E782 Mixed hyperlipidemia: Secondary | ICD-10-CM | POA: Diagnosis not present

## 2019-02-24 DIAGNOSIS — I10 Essential (primary) hypertension: Secondary | ICD-10-CM | POA: Diagnosis not present

## 2019-02-27 ENCOUNTER — Ambulatory Visit: Payer: Medicare Other | Admitting: Podiatry

## 2019-02-27 ENCOUNTER — Encounter: Payer: Self-pay | Admitting: Podiatry

## 2019-02-27 ENCOUNTER — Other Ambulatory Visit: Payer: Self-pay

## 2019-02-27 DIAGNOSIS — L84 Corns and callosities: Secondary | ICD-10-CM | POA: Diagnosis not present

## 2019-02-27 DIAGNOSIS — E1149 Type 2 diabetes mellitus with other diabetic neurological complication: Secondary | ICD-10-CM | POA: Diagnosis not present

## 2019-02-27 DIAGNOSIS — E1140 Type 2 diabetes mellitus with diabetic neuropathy, unspecified: Secondary | ICD-10-CM | POA: Diagnosis not present

## 2019-03-05 NOTE — Progress Notes (Signed)
Subjective: 68 year old male presents the office today for follow-up evaluation of a wound/callus on the left big toe.  He states he is doing much better denies any open sores.  He said the calluses need to be trimmed.  Denies any swelling or redness or any drainage.  He denies any recent injury.  Still having to wear steel toed shoes but he is otherwise on the right foot in regular shoe on the left foot while working. Denies any systemic complaints such as fevers, chills, nausea, vomiting. No acute changes since last appointment, and no other complaints at this time.   Objective: AAO x3, NAD DP/PT pulses palpable bilaterally, CRT less than 3 seconds Hyperkeratotic lesion left and right medial hallux with dried blood at the left side worse than the right.  Upon debridement the wound appears to be healed the area is preulcerative.  No drainage or pus.  No erythema or cellulitis.  No fluctuation crepitation. No open lesions or pre-ulcerative lesions.  No pain with calf compression, swelling, warmth, erythema  Assessment: 68 year old male preulcerative area bilateral hallux  Plan: -All treatment options discussed with the patient including all alternatives, risks, complications.  -Bilateral hyperkeratotic lesions were debrided without any complications or bleeding.  Discussed that the right side is starting because he is been compensating of emergency different shoes.  We discussed wearing the same shoe.  Also he will hopefully be retiring next couple weeks and hopefully not have to wear steel toed shoes all the time will be helpful for his feet.  Monitoring skin breakdown or any signs or symptoms of infection.  Return in about 4 weeks (around 03/27/2019).  Trula Slade DPM

## 2019-03-20 ENCOUNTER — Ambulatory Visit: Payer: Medicare Other | Admitting: Podiatry

## 2019-03-21 ENCOUNTER — Other Ambulatory Visit: Payer: Self-pay

## 2019-03-21 ENCOUNTER — Encounter: Payer: Self-pay | Admitting: Podiatry

## 2019-03-21 ENCOUNTER — Ambulatory Visit (INDEPENDENT_AMBULATORY_CARE_PROVIDER_SITE_OTHER): Payer: Medicare Other

## 2019-03-21 ENCOUNTER — Ambulatory Visit (INDEPENDENT_AMBULATORY_CARE_PROVIDER_SITE_OTHER): Payer: Medicare Other | Admitting: Podiatry

## 2019-03-21 DIAGNOSIS — M795 Residual foreign body in soft tissue: Secondary | ICD-10-CM

## 2019-03-21 DIAGNOSIS — L97521 Non-pressure chronic ulcer of other part of left foot limited to breakdown of skin: Secondary | ICD-10-CM | POA: Diagnosis not present

## 2019-03-21 MED ORDER — DOXYCYCLINE HYCLATE 100 MG PO TABS: 100.0000 mg | ORAL_TABLET | Freq: Two times a day (BID) | ORAL | 0 refills | Status: DC

## 2019-03-21 NOTE — Patient Instructions (Addendum)
Skin Foreign Body A skin foreign body is an object that is stuck in the skin. Common objects that get stuck in the skin include:  Wood (splinter).  Glass.  Rock.  Nails.  Needles.  Thorns or cactus spines.  Fiberglass slivers.  Fish hooks.  BBs. Foreign bodies may damage tissue or cause infection. If the foreign body does not cause any pain or infection, it may be okay to leave it in the skin. A growth called a granuloma may form around a foreign body that is left in the skin. What are the causes? This condition is caused by an object getting lodged under the skin, usually by accident. Children may get a skin foreign body while playing outside. Adults may get a skin foreign body after breaking glass or while working with wood, fiberglass, or stone material. In some cases, the object may get stuck in an open wound after an injury. What are the signs or symptoms? Symptoms of this condition include:  Pain.  A feeling of something being stuck under the skin. How is this diagnosed? This condition is diagnosed based on:  Your medical history and symptoms.  A physical exam.  Imaging tests, such as: ? X-rays. ? CT scans. ? Ultrasounds. How is this treated? Treatment for this condition depends on what the foreign body is, where it is, and whether it is causing infection or other symptoms. Treatment may involve:  Flushing the affected area with a salt-water solution to remove dirt or debris.  Removing all or part of the object with a needle and metal tweezers. In some cases, an incision may be made in the skin to allow access to the object.  Waiting to remove the object until it moves closer to the surface of the skin. This may take several days.  Leaving the object in place. This may be done if the object is not causing any symptoms or if removal will cause more damage to the skin or tissue.  Taking antibiotic pills or using antibiotic ointment to treat or prevent  infection.  Having a surgical procedure to remove a foreign body that is deep inside the tissue or that has been covered by a granuloma. Follow these instructions at home: Wound or incision care   If the foreign body was removed, follow instructions from your health care provider about how to take care of your wound or incision. Make sure you: ? Wash your hands with soap and water before and after you change your bandage (dressing). If soap and water are not available, use hand sanitizer. ? Change your dressing as told by your health care provider. ? Leave stitches (sutures), skin glue, or adhesive strips in place. These skin closures may need to stay in place for 2 weeks or longer. If adhesive strip edges start to loosen and curl up, you may trim the loose edges. Do not remove adhesive strips completely unless your health care provider tells you to do that.  Check your wound or incision every day for signs of infection. This is especially important if the foreign body was left in place in the skin. Check for: ? Redness, swelling, or pain. ? Fluid or blood. ? Pus or a bad smell. ? Warmth.  If the foreign body was in your lip, you may be directed to rinse your mouth with a salt-water mixture 3-4 times per day or as needed. To make a salt-water mixture, completely dissolve -1 tsp (3-6 g) of salt in 1 cup (237 mL)   of warm water. General instructions  Take over-the-counter and prescription medicines only as told by your health care provider.  If you were prescribed an antibiotic medicine or ointment, use it as told by your health care provider. Do not stop using the antibiotic even if you start to feel better.  Keep all follow-up visits as told by your health care provider. This is important. Contact a health care provider if:  You develop more pain or other new symptoms around the area where the object entered the skin.  You have redness, swelling, or pain around your wound or  incision.  You have fluid or blood coming from your wound or incision.  Your wound or incision feels warm to the touch.  You have pus or a bad smell coming from your wound or incision.  You have a fever. Get help right away if:  You have severe pain that does not get better with medicine. Summary  A skin foreign body is an object that is stuck in the skin. Common objects that get stuck in the skin include wood, glass, rock, thorns, and fiberglass slivers.  Treatment for this condition depends on what the foreign body is, where it is, and whether it is causing infection or other symptoms.  Treatment may include removing the foreign body or leaving it in place. It is important to watch the wound or incision for signs of infection, especially if the object was left in place in the skin. This information is not intended to replace advice given to you by your health care provider. Make sure you discuss any questions you have with your health care provider. Document Revised: 06/16/2018 Document Reviewed: 05/27/2016 Elsevier Patient Education  2020 Else[employer] Inc.  

## 2019-03-22 DIAGNOSIS — M795 Residual foreign body in soft tissue: Secondary | ICD-10-CM | POA: Insufficient documentation

## 2019-03-22 NOTE — Progress Notes (Signed)
Subjective: 69 year old male presents the office today for concerns of possible residual foreign body to the bottom of his right heel.  He states that last Thursday or Friday he has some tenderness of the right heel and his wife did pull out a foreign object which he brought into the office today.  Since then the pain is improved but he still concerned there may be something present.  No drainage or pus.  The calluses have been doing well. Denies any systemic complaints such as fevers, chills, nausea, vomiting. No acute changes since last appointment, and no other complaints at this time.   Objective: AAO x3, NAD DP/PT pulses palpable bilaterally, CRT less than 3 seconds Chronic Charcot changes are present with midfoot arthritic changes.  Hyperkeratotic lesions bilateral hallux.  No ongoing ulceration.  No signs of infection.   On the plantar aspect of the right heel is what appears to be a small puncture wound.  Upon debridement there was an ulceration measuring 0.1 x 0.1 x 0.5 cm.  I was able to remove the small foreign body present.  There is unclear what the foreign object is not compared to what he brought in.  There appeared to be a hardened leave from a Christmas tree.  No pain with calf compression, swelling, warmth, erythema  Assessment: Foreign body right foot  Plan: -All treatment options discussed with the patient including all alternatives, risks, complications.  -X-rays obtained reviewed.  No evidence of acute fracture or foreign body -I debrided the area today and I was able to remove a small piece of foreign object.  Upon further debridement I was unable to identify any further foreign objects.  Will start doxycycline.  Antibiotic ointment dressing changes daily.  Offloading.  Monitoring signs or symptoms of infection. -Calluses to bilateral hallux are doing well. -Patient encouraged to call the office with any questions, concerns, change in symptoms.   Trula Slade DPM

## 2019-03-27 ENCOUNTER — Ambulatory Visit: Payer: Medicare Other | Admitting: Podiatry

## 2019-03-27 DIAGNOSIS — E1149 Type 2 diabetes mellitus with other diabetic neurological complication: Secondary | ICD-10-CM | POA: Diagnosis not present

## 2019-03-27 DIAGNOSIS — M109 Gout, unspecified: Secondary | ICD-10-CM | POA: Diagnosis not present

## 2019-03-27 DIAGNOSIS — I10 Essential (primary) hypertension: Secondary | ICD-10-CM | POA: Diagnosis not present

## 2019-03-27 DIAGNOSIS — E782 Mixed hyperlipidemia: Secondary | ICD-10-CM | POA: Diagnosis not present

## 2019-03-30 ENCOUNTER — Other Ambulatory Visit: Payer: Self-pay

## 2019-03-30 ENCOUNTER — Ambulatory Visit: Payer: Medicare Other | Admitting: Podiatry

## 2019-03-30 DIAGNOSIS — L84 Corns and callosities: Secondary | ICD-10-CM

## 2019-03-30 DIAGNOSIS — L97511 Non-pressure chronic ulcer of other part of right foot limited to breakdown of skin: Secondary | ICD-10-CM

## 2019-04-04 NOTE — Progress Notes (Signed)
Subjective: 69 year old male presents the office today for follow-up evaluation of a wound on the bottom of the right heel due to foreign body as well as for calluses on both of his big toes.  He states he is doing well is not having any significant pain and denies any redness or swelling or any drainage.  Since I last saw him he has retired he has been off of his feet more. Denies any systemic complaints such as fevers, chills, nausea, vomiting. No acute changes since last appointment, and no other complaints at this time.   Objective: AAO x3, NAD DP/PT pulses palpable bilaterally, CRT less than 3 seconds Hyperkeratotic lesions to medial hallux which are improved.  Upon debridement there is no underlying ulceration drainage or any signs of infection. To the plantar aspect of the right heel is a hyperkeratotic lesion around the area of the wound from the foreign body.  Upon debridement small amount of clear drainage is expressed but there is no purulence.  The wound is more superficial only probes approximately 0.2 cm there is no surrounding edema, erythema, or pus or any signs of infection otherwise. No open lesions or pre-ulcerative lesions.  No pain with calf compression, swelling, warmth, erythema  Assessment: Healing wound right heel; preulcerative calluses bilateral hallux  Plan: -All treatment options discussed with the patient including all alternatives, risks, complications.  -Bilateral hallux calluses without any complications or bleeding. -Debrided the callus, wound on the right heel without any complications.  Recommended a small amount of antibiotic ointment dressing changes daily.  Offloading.  Monitor closely for any signs or symptoms of infection. -Patient encouraged to call the office with any questions, concerns, change in symptoms.    Trula Slade DPM

## 2019-04-20 ENCOUNTER — Ambulatory Visit: Payer: Medicare Other | Admitting: Podiatry

## 2019-04-24 DIAGNOSIS — Z20828 Contact with and (suspected) exposure to other viral communicable diseases: Secondary | ICD-10-CM | POA: Diagnosis not present

## 2019-05-05 ENCOUNTER — Ambulatory Visit: Payer: Medicare Other | Attending: Internal Medicine

## 2019-05-05 DIAGNOSIS — Z23 Encounter for immunization: Secondary | ICD-10-CM | POA: Insufficient documentation

## 2019-05-05 NOTE — Progress Notes (Signed)
   Covid-19 Vaccination Clinic  Name:  Glenn Herrera    MRN: [id number] DOB: [birth year]  05/05/2019  Mr. [name] was observed post Covid-19 immunization for 15 minutes without incidence. He was provided with Vaccine Information Sheet and instruction to access the V-Safe system.   Mr. [name] was instructed to call 911 with any severe reactions post vaccine: Marland Kitchen Difficulty breathing  . Swelling of your face and throat  . A fast heartbeat  . A bad rash all over your body  . Dizziness and weakness    Immunizations Administered    Name Date Dose VIS Date Route   Pfizer COVID-19 Vaccine 05/05/2019  8:19 AM 0.3 mL 02/17/2019 Intramuscular   Manufacturer: [place]   Lot: [id number]   [place]: [id number]

## 2019-05-30 ENCOUNTER — Ambulatory Visit: Payer: Medicare Other | Attending: Internal Medicine

## 2019-05-30 DIAGNOSIS — Z23 Encounter for immunization: Secondary | ICD-10-CM

## 2019-05-30 NOTE — Progress Notes (Signed)
   Covid-19 Vaccination Clinic  Name:  Glenn Herrera    MRN: [id number] DOB: 23-Aug-1950  05/30/2019  [name] was observed post Covid-19 immunization for 15 minutes without incident. He was provided with Vaccine Information Sheet and instruction to access the V-Safe system.   [name] was instructed to call 911 with any severe reactions post vaccine: Marland Kitchen Difficulty breathing  . Swelling of face and throat  . A fast heartbeat  . A bad rash all over body  . Dizziness and weakness   Immunizations Administered    Name Date Dose VIS Date Route   Pfizer COVID-19 Vaccine 05/30/2019  9:20 AM 0.3 mL 02/17/2019 Intramuscular   Manufacturer: [place]   Lot: [id number]   [place]: [id number]

## 2019-10-04 DIAGNOSIS — Z0000 Encounter for general adult medical examination without abnormal findings: Secondary | ICD-10-CM | POA: Diagnosis not present

## 2019-10-04 DIAGNOSIS — E1149 Type 2 diabetes mellitus with other diabetic neurological complication: Secondary | ICD-10-CM | POA: Diagnosis not present

## 2019-10-04 DIAGNOSIS — E782 Mixed hyperlipidemia: Secondary | ICD-10-CM | POA: Diagnosis not present

## 2019-10-04 DIAGNOSIS — I10 Essential (primary) hypertension: Secondary | ICD-10-CM | POA: Diagnosis not present

## 2019-12-16 DIAGNOSIS — Z23 Encounter for immunization: Secondary | ICD-10-CM | POA: Diagnosis not present

## 2020-01-12 DIAGNOSIS — E1169 Type 2 diabetes mellitus with other specified complication: Secondary | ICD-10-CM | POA: Diagnosis not present

## 2020-04-26 DIAGNOSIS — E782 Mixed hyperlipidemia: Secondary | ICD-10-CM | POA: Diagnosis not present

## 2020-04-26 DIAGNOSIS — G629 Polyneuropathy, unspecified: Secondary | ICD-10-CM | POA: Diagnosis not present

## 2020-04-26 DIAGNOSIS — I10 Essential (primary) hypertension: Secondary | ICD-10-CM | POA: Diagnosis not present

## 2020-04-26 DIAGNOSIS — E1149 Type 2 diabetes mellitus with other diabetic neurological complication: Secondary | ICD-10-CM | POA: Diagnosis not present

## 2020-05-08 DIAGNOSIS — G4733 Obstructive sleep apnea (adult) (pediatric): Secondary | ICD-10-CM | POA: Diagnosis not present

## 2020-05-08 DIAGNOSIS — I10 Essential (primary) hypertension: Secondary | ICD-10-CM | POA: Diagnosis not present

## 2020-05-08 DIAGNOSIS — E1149 Type 2 diabetes mellitus with other diabetic neurological complication: Secondary | ICD-10-CM | POA: Diagnosis not present

## 2020-06-11 ENCOUNTER — Encounter (HOSPITAL_BASED_OUTPATIENT_CLINIC_OR_DEPARTMENT_OTHER): Payer: Self-pay

## 2020-06-11 DIAGNOSIS — R0683 Snoring: Secondary | ICD-10-CM

## 2020-06-11 DIAGNOSIS — G4710 Hypersomnia, unspecified: Secondary | ICD-10-CM

## 2020-06-11 DIAGNOSIS — G4733 Obstructive sleep apnea (adult) (pediatric): Secondary | ICD-10-CM

## 2020-07-16 ENCOUNTER — Encounter (HOSPITAL_BASED_OUTPATIENT_CLINIC_OR_DEPARTMENT_OTHER): Payer: Medicare Other | Admitting: Internal Medicine

## 2020-07-23 ENCOUNTER — Ambulatory Visit (HOSPITAL_BASED_OUTPATIENT_CLINIC_OR_DEPARTMENT_OTHER): Payer: Medicare Other | Admitting: Internal Medicine

## 2020-07-23 ENCOUNTER — Other Ambulatory Visit: Payer: Self-pay

## 2020-08-22 ENCOUNTER — Other Ambulatory Visit: Payer: Self-pay

## 2020-08-22 ENCOUNTER — Ambulatory Visit (HOSPITAL_BASED_OUTPATIENT_CLINIC_OR_DEPARTMENT_OTHER): Payer: Medicare Other | Attending: Internal Medicine | Admitting: Internal Medicine

## 2020-08-22 DIAGNOSIS — G4733 Obstructive sleep apnea (adult) (pediatric): Secondary | ICD-10-CM

## 2020-08-22 DIAGNOSIS — R0683 Snoring: Secondary | ICD-10-CM

## 2020-08-22 DIAGNOSIS — R0902 Hypoxemia: Secondary | ICD-10-CM | POA: Diagnosis not present

## 2020-08-22 DIAGNOSIS — G4736 Sleep related hypoventilation in conditions classified elsewhere: Secondary | ICD-10-CM | POA: Insufficient documentation

## 2020-08-22 DIAGNOSIS — G4710 Hypersomnia, unspecified: Secondary | ICD-10-CM

## 2020-08-27 DIAGNOSIS — G4733 Obstructive sleep apnea (adult) (pediatric): Secondary | ICD-10-CM | POA: Diagnosis not present

## 2020-09-04 NOTE — Procedures (Signed)
NAME: Glenn Herrera DATE OF BIRTH:  [birth year] MEDICAL RECORD NUMBER [id number]  LOCATION: [facility]  PHYSICIAN: Marius Ditch  DATE OF STUDY: 08/22/2020  SLEEP STUDY TYPE: Positive Airway Pressure Titration               REFERRING PHYSICIAN: Marius Ditch, MD/Jennifer Percell Miller, PA-C  EPWORTH SLEEPINESS SCORE:  11 HEIGHT: 5\' 9"  (175.3 cm)  WEIGHT: 290 lb (131.5 kg)    Body mass index is 42.83 kg/m.  NECK SIZE: 23 in.  CLINICAL INFORMATION The patient was referred to the sleep center for PAP titration. He has severe OSA found on HSAT with prlonged hypoxemia. There is concern for obesity hypoventilation syndrome and incomplete resolution of hypoxemia with PAP therapy. Oxygen may be needed.  MEDICATIONS Patient self administered medications include: ONE A DAY VITAMINS MEN, CELECOXIB, ZOCOR, ACTOS, METFORMIN, AMARYL, LOTENSIN HCT, ASPIRIN, ZYLOPRIM. No sleep medicine administered.Marland Kitchen  SLEEP STUDY TECHNIQUE The patient underwent an attended overnight polysomnography titration to assess the effects of cpap therapy. The following variables were monitored: EEG(C4-A1, C3-A2, O1-A2, O2-A1), EOG, submental and leg EMG, ECG, oxyhemoglobin saturation by pulse oximetry, thoracic and abdominal respiratory effort belts, nasal/oral airflow by pressure sensor, body position sensor and snoring sensor. CPAP pressure was titrated to eliminate apneas, hypopneas and oxygen desaturation.  TECHNICAL COMMENTS Comments added by Technician: O2 initiated due to low sats. Comments added by Scorer: N/A  SLEEP ARCHITECTURE The study was initiated at 10:50:27 PM and terminated at 5:02:06 AM. Total recorded time was 371.6 minutes. EEG confirmed total sleep time was 355.2 minutes yielding a sleep efficiency of 95.6%%. Sleep onset after lights out was 0.5 minutes with a REM latency of 40.5 minutes. The patient spent 1.1%% of the night in stage N1 sleep, 66.4%% in stage N2 sleep, 0.0%% in  stage N3 and 32.5% in REM. The Arousal Index was 1.2/hour.  RESPIRATORY PARAMETERS The overall AHI was 47.3 per hour, and the RDI was 47.3 events/hour with a central apnea index of 0 events per hour. The most appropriate setting of PAP was IPAP/EPAP 22/18 cm H2O with oxygen at 2 LPM. At this setting: - the sleep efficiency was 100% - the patient was supine for 100%. - the AHI was 36.2 events per hour - the RDI was 36.2 events/hour (with 0 central events) - the arousal index was 0 per hour - and the oxygen nadir was 85.0%.  The patient stated that his sleep during this study was better than his usual sleep at home  LEG MOVEMENT DATA The total leg movements were 470 with a resulting leg movement index of 79.4/hr. Associated arousal with leg movement index was 0.0/hr.  CARDIAC DATA The underlying cardiac rhythm was most consistent with sinus rhythm. Mean heart rate during sleep was 76.8 bpm. Additional rhythm abnormalities include None.  IMPRESSIONS - Obstructive Sleep apnea (OSA) - optimal pressure not attained. - Significant leg movements during sleep. However, no significant associated arousals.  DIAGNOSIS - Obstructive Sleep Apnea (G47.33) - Nocturnal hypoxemia requiring oxygen.  RECOMMENDATIONS - Trial of BPAP therapy of 22/18 cm H2O with a Medium size Fisher&Paykel Full Face Mask Simplus mask and heated humidification and oxygen at 2 LPM. - Close followup with download and consideration of overnight oximetry on this pressure.  Marius Ditch Sleep specialist, [place] Board of [employer]  ELECTRONICALLY SIGNED ON:  09/04/2020, 6:42 AM [place] PH: [phone]   FX: [phone] [place]  OF SLEEP MEDICINE

## 2020-10-10 DIAGNOSIS — I10 Essential (primary) hypertension: Secondary | ICD-10-CM | POA: Diagnosis not present

## 2020-10-10 DIAGNOSIS — Z0000 Encounter for general adult medical examination without abnormal findings: Secondary | ICD-10-CM | POA: Diagnosis not present

## 2020-10-10 DIAGNOSIS — E782 Mixed hyperlipidemia: Secondary | ICD-10-CM | POA: Diagnosis not present

## 2020-10-10 DIAGNOSIS — E1149 Type 2 diabetes mellitus with other diabetic neurological complication: Secondary | ICD-10-CM | POA: Diagnosis not present

## 2020-10-10 DIAGNOSIS — Z8546 Personal history of malignant neoplasm of prostate: Secondary | ICD-10-CM | POA: Diagnosis not present

## 2020-10-22 DIAGNOSIS — G4733 Obstructive sleep apnea (adult) (pediatric): Secondary | ICD-10-CM | POA: Diagnosis not present

## 2020-10-23 DIAGNOSIS — G4733 Obstructive sleep apnea (adult) (pediatric): Secondary | ICD-10-CM | POA: Diagnosis not present

## 2020-11-03 DIAGNOSIS — G4733 Obstructive sleep apnea (adult) (pediatric): Secondary | ICD-10-CM | POA: Diagnosis not present

## 2020-11-03 DIAGNOSIS — R0902 Hypoxemia: Secondary | ICD-10-CM | POA: Diagnosis not present

## 2020-11-03 DIAGNOSIS — I10 Essential (primary) hypertension: Secondary | ICD-10-CM | POA: Diagnosis not present

## 2020-11-22 DIAGNOSIS — G4733 Obstructive sleep apnea (adult) (pediatric): Secondary | ICD-10-CM | POA: Diagnosis not present

## 2020-12-04 DIAGNOSIS — I10 Essential (primary) hypertension: Secondary | ICD-10-CM | POA: Diagnosis not present

## 2020-12-04 DIAGNOSIS — R0902 Hypoxemia: Secondary | ICD-10-CM | POA: Diagnosis not present

## 2020-12-04 DIAGNOSIS — G4733 Obstructive sleep apnea (adult) (pediatric): Secondary | ICD-10-CM | POA: Diagnosis not present

## 2020-12-21 DIAGNOSIS — Z23 Encounter for immunization: Secondary | ICD-10-CM | POA: Diagnosis not present

## 2020-12-27 DIAGNOSIS — G4733 Obstructive sleep apnea (adult) (pediatric): Secondary | ICD-10-CM | POA: Diagnosis not present

## 2021-01-03 DIAGNOSIS — I10 Essential (primary) hypertension: Secondary | ICD-10-CM | POA: Diagnosis not present

## 2021-01-03 DIAGNOSIS — R0902 Hypoxemia: Secondary | ICD-10-CM | POA: Diagnosis not present

## 2021-01-03 DIAGNOSIS — G4733 Obstructive sleep apnea (adult) (pediatric): Secondary | ICD-10-CM | POA: Diagnosis not present

## 2021-01-21 DIAGNOSIS — G4733 Obstructive sleep apnea (adult) (pediatric): Secondary | ICD-10-CM | POA: Diagnosis not present

## 2021-01-22 DIAGNOSIS — G4733 Obstructive sleep apnea (adult) (pediatric): Secondary | ICD-10-CM | POA: Diagnosis not present

## 2021-02-03 DIAGNOSIS — I10 Essential (primary) hypertension: Secondary | ICD-10-CM | POA: Diagnosis not present

## 2021-02-03 DIAGNOSIS — R0902 Hypoxemia: Secondary | ICD-10-CM | POA: Diagnosis not present

## 2021-02-03 DIAGNOSIS — G4733 Obstructive sleep apnea (adult) (pediatric): Secondary | ICD-10-CM | POA: Diagnosis not present

## 2021-02-20 DIAGNOSIS — G4733 Obstructive sleep apnea (adult) (pediatric): Secondary | ICD-10-CM | POA: Diagnosis not present

## 2021-03-01 DIAGNOSIS — R0781 Pleurodynia: Secondary | ICD-10-CM | POA: Diagnosis not present

## 2021-03-01 DIAGNOSIS — M25512 Pain in left shoulder: Secondary | ICD-10-CM | POA: Diagnosis not present

## 2021-03-05 DIAGNOSIS — G4733 Obstructive sleep apnea (adult) (pediatric): Secondary | ICD-10-CM | POA: Diagnosis not present

## 2021-03-05 DIAGNOSIS — I10 Essential (primary) hypertension: Secondary | ICD-10-CM | POA: Diagnosis not present

## 2021-03-05 DIAGNOSIS — R0902 Hypoxemia: Secondary | ICD-10-CM | POA: Diagnosis not present

## 2021-03-16 DIAGNOSIS — M25512 Pain in left shoulder: Secondary | ICD-10-CM | POA: Diagnosis not present

## 2021-03-19 DIAGNOSIS — M25512 Pain in left shoulder: Secondary | ICD-10-CM | POA: Diagnosis not present

## 2021-03-19 DIAGNOSIS — M75122 Complete rotator cuff tear or rupture of left shoulder, not specified as traumatic: Secondary | ICD-10-CM | POA: Diagnosis not present

## 2021-03-23 DIAGNOSIS — G4733 Obstructive sleep apnea (adult) (pediatric): Secondary | ICD-10-CM | POA: Diagnosis not present

## 2021-03-31 DIAGNOSIS — E1149 Type 2 diabetes mellitus with other diabetic neurological complication: Secondary | ICD-10-CM | POA: Diagnosis not present

## 2021-03-31 DIAGNOSIS — E782 Mixed hyperlipidemia: Secondary | ICD-10-CM | POA: Diagnosis not present

## 2021-03-31 DIAGNOSIS — G629 Polyneuropathy, unspecified: Secondary | ICD-10-CM | POA: Diagnosis not present

## 2021-03-31 DIAGNOSIS — Z01810 Encounter for preprocedural cardiovascular examination: Secondary | ICD-10-CM | POA: Diagnosis not present

## 2021-03-31 DIAGNOSIS — I10 Essential (primary) hypertension: Secondary | ICD-10-CM | POA: Diagnosis not present

## 2021-03-31 DIAGNOSIS — D649 Anemia, unspecified: Secondary | ICD-10-CM | POA: Diagnosis not present

## 2021-03-31 DIAGNOSIS — M109 Gout, unspecified: Secondary | ICD-10-CM | POA: Diagnosis not present

## 2021-04-05 DIAGNOSIS — I10 Essential (primary) hypertension: Secondary | ICD-10-CM | POA: Diagnosis not present

## 2021-04-05 DIAGNOSIS — G4733 Obstructive sleep apnea (adult) (pediatric): Secondary | ICD-10-CM | POA: Diagnosis not present

## 2021-04-05 DIAGNOSIS — R0902 Hypoxemia: Secondary | ICD-10-CM | POA: Diagnosis not present

## 2021-04-07 DIAGNOSIS — D509 Iron deficiency anemia, unspecified: Secondary | ICD-10-CM | POA: Diagnosis not present

## 2021-04-22 DIAGNOSIS — G4733 Obstructive sleep apnea (adult) (pediatric): Secondary | ICD-10-CM | POA: Diagnosis not present

## 2021-04-24 ENCOUNTER — Other Ambulatory Visit (HOSPITAL_COMMUNITY): Payer: Self-pay | Admitting: *Deleted

## 2021-04-25 ENCOUNTER — Encounter (HOSPITAL_COMMUNITY)
Admission: RE | Admit: 2021-04-25 | Discharge: 2021-04-25 | Disposition: A | Discharge status: Home / Self Care | Payer: Medicare Other | Source: Ambulatory Visit | Attending: Family Medicine | Admitting: Family Medicine

## 2021-04-25 ENCOUNTER — Other Ambulatory Visit: Payer: Self-pay

## 2021-04-25 DIAGNOSIS — D509 Iron deficiency anemia, unspecified: Secondary | ICD-10-CM | POA: Insufficient documentation

## 2021-04-25 MED ORDER — SODIUM CHLORIDE 0.9 % IV SOLN
510.0000 mg | INTRAVENOUS | Status: DC
  Administered 2021-04-25: 510 mg via INTRAVENOUS
  Filled 2021-04-25: qty 510

## 2021-05-01 DIAGNOSIS — H35033 Hypertensive retinopathy, bilateral: Secondary | ICD-10-CM | POA: Diagnosis not present

## 2021-05-01 DIAGNOSIS — I10 Essential (primary) hypertension: Secondary | ICD-10-CM | POA: Diagnosis not present

## 2021-05-01 DIAGNOSIS — H26492 Other secondary cataract, left eye: Secondary | ICD-10-CM | POA: Diagnosis not present

## 2021-05-01 DIAGNOSIS — E119 Type 2 diabetes mellitus without complications: Secondary | ICD-10-CM | POA: Diagnosis not present

## 2021-05-02 ENCOUNTER — Other Ambulatory Visit: Payer: Self-pay

## 2021-05-02 ENCOUNTER — Encounter (HOSPITAL_COMMUNITY)
Admission: RE | Admit: 2021-05-02 | Discharge: 2021-05-02 | Disposition: A | Discharge status: Home / Self Care | Payer: Medicare Other | Source: Ambulatory Visit | Attending: Family Medicine | Admitting: Family Medicine

## 2021-05-02 DIAGNOSIS — D509 Iron deficiency anemia, unspecified: Secondary | ICD-10-CM | POA: Diagnosis not present

## 2021-05-02 MED ORDER — FERUMOXYTOL INJECTION 510 MG/17 ML
510.0000 mg | INTRAVENOUS | Status: AC
  Administered 2021-05-02: 510 mg via INTRAVENOUS
  Filled 2021-05-02: qty 510

## 2021-05-06 DIAGNOSIS — I10 Essential (primary) hypertension: Secondary | ICD-10-CM | POA: Diagnosis not present

## 2021-05-06 DIAGNOSIS — G4733 Obstructive sleep apnea (adult) (pediatric): Secondary | ICD-10-CM | POA: Diagnosis not present

## 2021-05-06 DIAGNOSIS — R0902 Hypoxemia: Secondary | ICD-10-CM | POA: Diagnosis not present

## 2021-05-16 DIAGNOSIS — I890 Lymphedema, not elsewhere classified: Secondary | ICD-10-CM | POA: Diagnosis not present

## 2021-05-16 DIAGNOSIS — M75122 Complete rotator cuff tear or rupture of left shoulder, not specified as traumatic: Secondary | ICD-10-CM | POA: Diagnosis not present

## 2021-05-16 DIAGNOSIS — M7542 Impingement syndrome of left shoulder: Secondary | ICD-10-CM | POA: Diagnosis not present

## 2021-05-16 DIAGNOSIS — M25512 Pain in left shoulder: Secondary | ICD-10-CM | POA: Diagnosis not present

## 2021-05-16 DIAGNOSIS — Z4889 Encounter for other specified surgical aftercare: Secondary | ICD-10-CM | POA: Diagnosis not present

## 2021-05-16 DIAGNOSIS — M659 Synovitis and tenosynovitis, unspecified: Secondary | ICD-10-CM | POA: Diagnosis not present

## 2021-05-16 DIAGNOSIS — M7522 Bicipital tendinitis, left shoulder: Secondary | ICD-10-CM | POA: Diagnosis not present

## 2021-05-16 DIAGNOSIS — S43432A Superior glenoid labrum lesion of left shoulder, initial encounter: Secondary | ICD-10-CM | POA: Diagnosis not present

## 2021-05-16 DIAGNOSIS — G8918 Other acute postprocedural pain: Secondary | ICD-10-CM | POA: Diagnosis not present

## 2021-05-20 DIAGNOSIS — G4733 Obstructive sleep apnea (adult) (pediatric): Secondary | ICD-10-CM | POA: Diagnosis not present

## 2021-05-26 DIAGNOSIS — M25612 Stiffness of left shoulder, not elsewhere classified: Secondary | ICD-10-CM | POA: Diagnosis not present

## 2021-05-26 DIAGNOSIS — M25512 Pain in left shoulder: Secondary | ICD-10-CM | POA: Diagnosis not present

## 2021-06-02 ENCOUNTER — Encounter: Payer: Self-pay | Admitting: Podiatry

## 2021-06-02 ENCOUNTER — Other Ambulatory Visit: Payer: Self-pay

## 2021-06-02 ENCOUNTER — Ambulatory Visit: Payer: Medicare Other | Admitting: Podiatry

## 2021-06-02 ENCOUNTER — Ambulatory Visit (INDEPENDENT_AMBULATORY_CARE_PROVIDER_SITE_OTHER): Payer: Medicare Other

## 2021-06-02 DIAGNOSIS — M14671 Charcot's joint, right ankle and foot: Secondary | ICD-10-CM | POA: Diagnosis not present

## 2021-06-02 DIAGNOSIS — M79674 Pain in right toe(s): Secondary | ICD-10-CM | POA: Diagnosis not present

## 2021-06-02 DIAGNOSIS — M1460 Charcot's joint, unspecified site: Secondary | ICD-10-CM

## 2021-06-02 DIAGNOSIS — R609 Edema, unspecified: Secondary | ICD-10-CM

## 2021-06-02 DIAGNOSIS — B351 Tinea unguium: Secondary | ICD-10-CM | POA: Diagnosis not present

## 2021-06-02 DIAGNOSIS — M79675 Pain in left toe(s): Secondary | ICD-10-CM | POA: Diagnosis not present

## 2021-06-02 NOTE — Progress Notes (Signed)
Subjective:  ? ?Patient ID: Glenn Herrera, male   DOB: 71 y.o.   MRN: [id number]  ? ?HPI ?Patient states he has developed swelling in his right foot over his left foot and states that he had shoulder surgery 2 weeks ago and has been more sedentary.  They also are concerned about the thickness of his nailbeds and their inability to cut especially since he had the surgery done and has diabetes ? ? ?ROS ? ? ?   ?Objective:  ?Physical Exam  ?Neurovascular status is unchanged from previous visit feet are found to be warm and there is edema in the right over left foot localized no proximal edema erythema drainage noted no indication of collapse of the arch.  Thickened yellow brittle nailbeds 1-5 both feet that do get painful ? ?   ?Assessment:  ?Probability for Charcot foot structure right over left that may be in a more active phase or may be due to the immobilization after the shoulder surgery with no current indication cellulitis but always a slight possibility ? ?   ?Plan:  ?Reviewed condition explained Charcot foot and also the mycotic nail infections with pain.  Went ahead and will just use Ace wraps and elevation and it should resolve as he gets more active with his shoulder surgery and I went ahead today I debrided nailbeds 1-5 both feet reviewed his x-rays and discussed Charcot deformity ? ?X-rays indicate he does have significant stress on his midfoot right over left not a huge difference from where it was several years ago when compared ?   ? ? ?

## 2021-06-03 ENCOUNTER — Other Ambulatory Visit: Payer: Self-pay | Admitting: Podiatry

## 2021-06-03 DIAGNOSIS — I10 Essential (primary) hypertension: Secondary | ICD-10-CM | POA: Diagnosis not present

## 2021-06-03 DIAGNOSIS — G4733 Obstructive sleep apnea (adult) (pediatric): Secondary | ICD-10-CM | POA: Diagnosis not present

## 2021-06-03 DIAGNOSIS — R0902 Hypoxemia: Secondary | ICD-10-CM | POA: Diagnosis not present

## 2021-06-03 DIAGNOSIS — M1460 Charcot's joint, unspecified site: Secondary | ICD-10-CM

## 2021-06-04 DIAGNOSIS — M25612 Stiffness of left shoulder, not elsewhere classified: Secondary | ICD-10-CM | POA: Diagnosis not present

## 2021-06-04 DIAGNOSIS — M25512 Pain in left shoulder: Secondary | ICD-10-CM | POA: Diagnosis not present

## 2021-06-11 DIAGNOSIS — M25512 Pain in left shoulder: Secondary | ICD-10-CM | POA: Diagnosis not present

## 2021-06-11 DIAGNOSIS — M25612 Stiffness of left shoulder, not elsewhere classified: Secondary | ICD-10-CM | POA: Diagnosis not present

## 2021-06-17 DIAGNOSIS — M25612 Stiffness of left shoulder, not elsewhere classified: Secondary | ICD-10-CM | POA: Diagnosis not present

## 2021-06-17 DIAGNOSIS — M25512 Pain in left shoulder: Secondary | ICD-10-CM | POA: Diagnosis not present

## 2021-06-20 DIAGNOSIS — M25512 Pain in left shoulder: Secondary | ICD-10-CM | POA: Diagnosis not present

## 2021-06-20 DIAGNOSIS — M25612 Stiffness of left shoulder, not elsewhere classified: Secondary | ICD-10-CM | POA: Diagnosis not present

## 2021-06-20 DIAGNOSIS — G4733 Obstructive sleep apnea (adult) (pediatric): Secondary | ICD-10-CM | POA: Diagnosis not present

## 2021-06-24 DIAGNOSIS — M25512 Pain in left shoulder: Secondary | ICD-10-CM | POA: Diagnosis not present

## 2021-06-24 DIAGNOSIS — M25612 Stiffness of left shoulder, not elsewhere classified: Secondary | ICD-10-CM | POA: Diagnosis not present

## 2021-06-27 DIAGNOSIS — M25612 Stiffness of left shoulder, not elsewhere classified: Secondary | ICD-10-CM | POA: Diagnosis not present

## 2021-06-27 DIAGNOSIS — M25512 Pain in left shoulder: Secondary | ICD-10-CM | POA: Diagnosis not present

## 2021-07-01 DIAGNOSIS — M25612 Stiffness of left shoulder, not elsewhere classified: Secondary | ICD-10-CM | POA: Diagnosis not present

## 2021-07-01 DIAGNOSIS — M25512 Pain in left shoulder: Secondary | ICD-10-CM | POA: Diagnosis not present

## 2021-07-04 DIAGNOSIS — I10 Essential (primary) hypertension: Secondary | ICD-10-CM | POA: Diagnosis not present

## 2021-07-04 DIAGNOSIS — M25612 Stiffness of left shoulder, not elsewhere classified: Secondary | ICD-10-CM | POA: Diagnosis not present

## 2021-07-04 DIAGNOSIS — M25512 Pain in left shoulder: Secondary | ICD-10-CM | POA: Diagnosis not present

## 2021-07-04 DIAGNOSIS — G4733 Obstructive sleep apnea (adult) (pediatric): Secondary | ICD-10-CM | POA: Diagnosis not present

## 2021-07-04 DIAGNOSIS — R0902 Hypoxemia: Secondary | ICD-10-CM | POA: Diagnosis not present

## 2021-07-08 DIAGNOSIS — M25612 Stiffness of left shoulder, not elsewhere classified: Secondary | ICD-10-CM | POA: Diagnosis not present

## 2021-07-08 DIAGNOSIS — M25512 Pain in left shoulder: Secondary | ICD-10-CM | POA: Diagnosis not present

## 2021-07-11 DIAGNOSIS — M25612 Stiffness of left shoulder, not elsewhere classified: Secondary | ICD-10-CM | POA: Diagnosis not present

## 2021-07-11 DIAGNOSIS — M25512 Pain in left shoulder: Secondary | ICD-10-CM | POA: Diagnosis not present

## 2021-07-15 DIAGNOSIS — M25512 Pain in left shoulder: Secondary | ICD-10-CM | POA: Diagnosis not present

## 2021-07-15 DIAGNOSIS — M25612 Stiffness of left shoulder, not elsewhere classified: Secondary | ICD-10-CM | POA: Diagnosis not present

## 2021-07-18 DIAGNOSIS — M25612 Stiffness of left shoulder, not elsewhere classified: Secondary | ICD-10-CM | POA: Diagnosis not present

## 2021-07-18 DIAGNOSIS — M25512 Pain in left shoulder: Secondary | ICD-10-CM | POA: Diagnosis not present

## 2021-07-21 DIAGNOSIS — M25512 Pain in left shoulder: Secondary | ICD-10-CM | POA: Diagnosis not present

## 2021-07-21 DIAGNOSIS — M25612 Stiffness of left shoulder, not elsewhere classified: Secondary | ICD-10-CM | POA: Diagnosis not present

## 2021-07-22 DIAGNOSIS — G4733 Obstructive sleep apnea (adult) (pediatric): Secondary | ICD-10-CM | POA: Diagnosis not present

## 2021-07-24 DIAGNOSIS — M25512 Pain in left shoulder: Secondary | ICD-10-CM | POA: Diagnosis not present

## 2021-07-24 DIAGNOSIS — M25612 Stiffness of left shoulder, not elsewhere classified: Secondary | ICD-10-CM | POA: Diagnosis not present

## 2021-07-28 DIAGNOSIS — M25512 Pain in left shoulder: Secondary | ICD-10-CM | POA: Diagnosis not present

## 2021-07-28 DIAGNOSIS — M25612 Stiffness of left shoulder, not elsewhere classified: Secondary | ICD-10-CM | POA: Diagnosis not present

## 2021-07-31 DIAGNOSIS — M25612 Stiffness of left shoulder, not elsewhere classified: Secondary | ICD-10-CM | POA: Diagnosis not present

## 2021-07-31 DIAGNOSIS — M25512 Pain in left shoulder: Secondary | ICD-10-CM | POA: Diagnosis not present

## 2021-08-03 DIAGNOSIS — R0902 Hypoxemia: Secondary | ICD-10-CM | POA: Diagnosis not present

## 2021-08-03 DIAGNOSIS — I10 Essential (primary) hypertension: Secondary | ICD-10-CM | POA: Diagnosis not present

## 2021-08-03 DIAGNOSIS — G4733 Obstructive sleep apnea (adult) (pediatric): Secondary | ICD-10-CM | POA: Diagnosis not present

## 2021-08-07 DIAGNOSIS — M25512 Pain in left shoulder: Secondary | ICD-10-CM | POA: Diagnosis not present

## 2021-08-07 DIAGNOSIS — M25612 Stiffness of left shoulder, not elsewhere classified: Secondary | ICD-10-CM | POA: Diagnosis not present

## 2021-08-14 DIAGNOSIS — M25512 Pain in left shoulder: Secondary | ICD-10-CM | POA: Diagnosis not present

## 2021-08-14 DIAGNOSIS — M25612 Stiffness of left shoulder, not elsewhere classified: Secondary | ICD-10-CM | POA: Diagnosis not present

## 2021-08-22 DIAGNOSIS — M25612 Stiffness of left shoulder, not elsewhere classified: Secondary | ICD-10-CM | POA: Diagnosis not present

## 2021-08-22 DIAGNOSIS — G4733 Obstructive sleep apnea (adult) (pediatric): Secondary | ICD-10-CM | POA: Diagnosis not present

## 2021-08-22 DIAGNOSIS — M25512 Pain in left shoulder: Secondary | ICD-10-CM | POA: Diagnosis not present

## 2021-08-29 DIAGNOSIS — M25612 Stiffness of left shoulder, not elsewhere classified: Secondary | ICD-10-CM | POA: Diagnosis not present

## 2021-08-29 DIAGNOSIS — M25512 Pain in left shoulder: Secondary | ICD-10-CM | POA: Diagnosis not present

## 2021-09-03 DIAGNOSIS — I10 Essential (primary) hypertension: Secondary | ICD-10-CM | POA: Diagnosis not present

## 2021-09-03 DIAGNOSIS — G4733 Obstructive sleep apnea (adult) (pediatric): Secondary | ICD-10-CM | POA: Diagnosis not present

## 2021-09-03 DIAGNOSIS — R0902 Hypoxemia: Secondary | ICD-10-CM | POA: Diagnosis not present

## 2021-09-21 DIAGNOSIS — G4733 Obstructive sleep apnea (adult) (pediatric): Secondary | ICD-10-CM | POA: Diagnosis not present

## 2021-10-03 DIAGNOSIS — R0902 Hypoxemia: Secondary | ICD-10-CM | POA: Diagnosis not present

## 2021-10-03 DIAGNOSIS — G4733 Obstructive sleep apnea (adult) (pediatric): Secondary | ICD-10-CM | POA: Diagnosis not present

## 2021-10-03 DIAGNOSIS — I10 Essential (primary) hypertension: Secondary | ICD-10-CM | POA: Diagnosis not present

## 2021-10-13 DIAGNOSIS — Z0000 Encounter for general adult medical examination without abnormal findings: Secondary | ICD-10-CM | POA: Diagnosis not present

## 2021-10-13 DIAGNOSIS — E1149 Type 2 diabetes mellitus with other diabetic neurological complication: Secondary | ICD-10-CM | POA: Diagnosis not present

## 2021-10-13 DIAGNOSIS — Z8546 Personal history of malignant neoplasm of prostate: Secondary | ICD-10-CM | POA: Diagnosis not present

## 2021-10-13 DIAGNOSIS — E782 Mixed hyperlipidemia: Secondary | ICD-10-CM | POA: Diagnosis not present

## 2021-10-13 DIAGNOSIS — I10 Essential (primary) hypertension: Secondary | ICD-10-CM | POA: Diagnosis not present

## 2021-10-21 DIAGNOSIS — G4733 Obstructive sleep apnea (adult) (pediatric): Secondary | ICD-10-CM | POA: Diagnosis not present

## 2021-10-22 DIAGNOSIS — G4733 Obstructive sleep apnea (adult) (pediatric): Secondary | ICD-10-CM | POA: Diagnosis not present

## 2021-11-03 DIAGNOSIS — G4733 Obstructive sleep apnea (adult) (pediatric): Secondary | ICD-10-CM | POA: Diagnosis not present

## 2021-11-03 DIAGNOSIS — G5621 Lesion of ulnar nerve, right upper limb: Secondary | ICD-10-CM | POA: Diagnosis not present

## 2021-11-03 DIAGNOSIS — G5603 Carpal tunnel syndrome, bilateral upper limbs: Secondary | ICD-10-CM | POA: Diagnosis not present

## 2021-11-03 DIAGNOSIS — G629 Polyneuropathy, unspecified: Secondary | ICD-10-CM | POA: Diagnosis not present

## 2021-11-03 DIAGNOSIS — R0902 Hypoxemia: Secondary | ICD-10-CM | POA: Diagnosis not present

## 2021-11-03 DIAGNOSIS — I10 Essential (primary) hypertension: Secondary | ICD-10-CM | POA: Diagnosis not present

## 2021-11-21 DIAGNOSIS — G4733 Obstructive sleep apnea (adult) (pediatric): Secondary | ICD-10-CM | POA: Diagnosis not present

## 2021-12-04 DIAGNOSIS — R0902 Hypoxemia: Secondary | ICD-10-CM | POA: Diagnosis not present

## 2021-12-04 DIAGNOSIS — G4733 Obstructive sleep apnea (adult) (pediatric): Secondary | ICD-10-CM | POA: Diagnosis not present

## 2021-12-04 DIAGNOSIS — I10 Essential (primary) hypertension: Secondary | ICD-10-CM | POA: Diagnosis not present

## 2021-12-11 ENCOUNTER — Encounter: Payer: Self-pay | Admitting: *Deleted

## 2021-12-15 ENCOUNTER — Encounter: Payer: Self-pay | Admitting: Diagnostic Neuroimaging

## 2021-12-15 ENCOUNTER — Ambulatory Visit: Payer: Medicare Other | Admitting: Diagnostic Neuroimaging

## 2021-12-15 VITALS — BP 142/67 | HR 68 | Ht 69.0 in | Wt 281.4 lb

## 2021-12-15 DIAGNOSIS — R200 Anesthesia of skin: Secondary | ICD-10-CM | POA: Diagnosis not present

## 2021-12-15 DIAGNOSIS — G5603 Carpal tunnel syndrome, bilateral upper limbs: Secondary | ICD-10-CM

## 2021-12-15 DIAGNOSIS — M79641 Pain in right hand: Secondary | ICD-10-CM

## 2021-12-15 DIAGNOSIS — R202 Paresthesia of skin: Secondary | ICD-10-CM

## 2021-12-15 DIAGNOSIS — M79642 Pain in left hand: Secondary | ICD-10-CM

## 2021-12-15 DIAGNOSIS — M19049 Primary osteoarthritis, unspecified hand: Secondary | ICD-10-CM

## 2021-12-15 DIAGNOSIS — E1142 Type 2 diabetes mellitus with diabetic polyneuropathy: Secondary | ICD-10-CM

## 2021-12-15 NOTE — Patient Instructions (Signed)
PAIN / NUMBNESS IN HANDS - likely due to severe carpal tunnel syndrome, superimposed on underlying diabetic peripheral neuropathy - also with moderate arthritis changes in hands - recommend to try wrist splints at night time - consider hand surgery consult for CTS, but not sure of benefit due to underlying neuropathy and arthritis issues

## 2021-12-15 NOTE — Progress Notes (Signed)
[facility]  PATIENT: Glenn Herrera DOB: 03/16/1950  REFERRING CLINICIAN: Gaynelle Arabian, MD HISTORY FROM: patient  REASON FOR VISIT: new consult   HISTORICAL  CHIEF COMPLAINT:  Chief Complaint  Patient presents with   New Patient (Initial Visit)    Pt reports feeling good. He states having numbness in both hands and fingertips. He states having numbness in his feet.  Room 7 with wife.    HISTORY OF PRESENT ILLNESS:   71 year old male here for evaluation of numbness and pain in hands.  Patient has had diabetes for more than 10 years.  Had onset of numbness and pain in feet before that time.  Around 10 years ago it started with numbness and tingling in the toes, feet and ankles.  Eventually this progressed to his fingers and hands.  Symptoms of worsened over time.  Last few years continues to have pain and stiffness in his finger joints, wrists, hands.  Also feels increasing numbness in fingertips and dropping things.  A1c used to be greater than 8 or 9, now 6.5.  He had EMG nerve conduction study which demonstrated significant axonal peripheral neuropathy with superimposed severe bilateral carpal tunnel syndrome.  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   REVIEW OF SYSTEMS: Full 14 system review of systems performed and negative with exception of: as per HPI.  ALLERGIES: No Known Allergies  HOME MEDICATIONS: Outpatient Medications Prior to Visit  Medication Sig Dispense Refill   allopurinol (ZYLOPRIM) 300 MG tablet Take 300 mg by mouth at bedtime.     aspirin 325 MG tablet Take 325 mg by mouth at bedtime.     glimepiride (AMARYL) 2 MG tablet Take 2 mg by mouth every evening.     metFORMIN (GLUCOPHAGE-XR) 500 MG 24 hr tablet Take 2 tablets (1,000 mg total) by mouth 2 times daily at 12 noon and 4 pm. Resume as before and restart at bedtime tonight, 02/07/16.     omeprazole (PRILOSEC) 20 MG capsule Take 20 mg by mouth daily.     pioglitazone  (ACTOS) 15 MG tablet TAKE 1 TABLET BY MOUTH ONCE DAILY FOR DIABETES     valsartan-hydrochlorothiazide (DIOVAN-HCT) 160-12.5 MG tablet Take 1 tablet by mouth daily.     acetaminophen (TYLENOL) 325 MG tablet Take 1 tablet (325 mg total) by mouth every 6 (six) hours as needed. (Patient not taking: Reported on 12/15/2021) 30 tablet 0   benazepril-hydrochlorthiazide (LOTENSIN HCT) 20-12.5 MG per tablet Take 1 tablet by mouth at bedtime. (Patient not taking: Reported on 12/15/2021)     cephALEXin (KEFLEX) 500 MG capsule Take 1 capsule (500 mg total) by mouth 3 (three) times daily. (Patient not taking: Reported on 12/15/2021) 30 capsule 0   ciprofloxacin (CIPRO) 500 MG tablet Take 1 tablet (500 mg total) by mouth 2 (two) times daily. (Patient not taking: Reported on 12/15/2021) 20 tablet 0   clindamycin (CLEOCIN) 300 MG capsule Take 1 capsule (300 mg total) by mouth 3 (three) times daily. (Patient not taking: Reported on 12/15/2021) 30 capsule 0   doxycycline (VIBRA-TABS) 100 MG tablet Take 1 tablet (100 mg total) by mouth 2 (two) times daily. (Patient not taking: Reported on 12/15/2021) 20 tablet 0   HYDROcodone-acetaminophen (NORCO/VICODIN) 5-325 MG tablet Take 1 tablet by mouth every 6 (six) hours as needed for moderate pain or severe pain. (Patient not taking: Reported on 12/15/2021) 10 tablet 0   polyethylene glycol (MIRALAX / GLYCOLAX) packet Take 17 g by mouth daily. (  Patient not taking: Reported on 12/15/2021) 14 each 0   saccharomyces boulardii (FLORASTOR) 250 MG capsule Take 1 capsule (250 mg total) by mouth 2 (two) times daily. (Patient not taking: Reported on 12/15/2021) 30 capsule 0   senna-docusate (SENOKOT-S) 8.6-50 MG tablet Take 1 tablet by mouth at bedtime. (Patient not taking: Reported on 12/15/2021) 30 tablet 0   simvastatin (ZOCOR) 20 MG tablet Take 20 mg by mouth at bedtime. (Patient not taking: Reported on 12/15/2021)     No facility-administered medications prior to visit.    PAST MEDICAL  HISTORY: Past Medical History:  Diagnosis Date   Cancer ([place])    prostate    Diabetes mellitus without complication ([place])    GERD (gastroesophageal reflux disease)    Gout    High blood cholesterol    HLD (hyperlipidemia)    Hypertension    OSA on CPAP    Peripheral neuropathy    Prostate cancer ([place])     PAST SURGICAL HISTORY: Past Surgical History:  Procedure Laterality Date   CATARACT EXTRACTION Bilateral 02/2009   3/82015   LAPAROSCOPIC APPENDECTOMY N/A 02/06/2016   Procedure: APPENDECTOMY LAPAROSCOPIC;  Surgeon: Excell Seltzer, MD;  Location: [facility];  Service: General;  Laterality: N/A;   LAPAROSCOPIC APPENDECTOMY  01/2016   PROSTATE SURGERY  2002    FAMILY HISTORY: Family History  Problem Relation Age of Onset   Diabetes Mother    Hypertension Mother    Diabetes Mellitus II Mother    Gout Mother    Sleep apnea Mother    Hypertension Father    Sleep apnea Brother    Diabetes Mellitus II Brother     SOCIAL HISTORY: Social History   Socioeconomic History   Marital status: Married    Spouse name: Not on file   Number of children: 2   Years of education: Not on file   Highest education level: Not on file  Occupational History   Not on file  Tobacco Use   Smoking status: Never   Smokeless tobacco: Never  Substance and Sexual Activity   Alcohol use: No   Drug use: No   Sexual activity: Not on file  Other Topics Concern   Not on file  Social History Narrative   Lives with wife   Caffeine 32-64 oz daily   Social Determinants of Health   Financial Resource Strain: Not on file  Food Insecurity: Not on file  Transportation Needs: Not on file  Physical Activity: Not on file  Stress: Not on file  Social Connections: Not on file  Intimate Partner Violence: Not on file     PHYSICAL EXAM  GENERAL EXAM/CONSTITUTIONAL: Vitals:  Vitals:   12/15/21 1112  BP: (!) 142/67  Pulse: 68  Weight: 281 lb 6 oz (127.6 kg)  Height: '5\' 9"'$  (1.753 m)   Body  mass index is 41.55 kg/m. Wt Readings from Last 3 Encounters:  12/15/21 281 lb 6 oz (127.6 kg)  05/02/21 280 lb (127 kg)  04/25/21 285 lb (129.3 kg)   Patient is in no distress; well developed, nourished and groomed; neck is supple  CARDIOVASCULAR: Examination of carotid arteries is normal; no carotid bruits Regular rate and rhythm, no murmurs Examination of peripheral vascular system by observation and palpation is normal  EYES: Ophthalmoscopic exam of optic discs and posterior segments is normal; no papilledema or hemorrhages No results found.  MUSCULOSKELETAL: Gait, strength, tone, movements noted in Neurologic exam below  NEUROLOGIC: MENTAL STATUS:  No data to display         awake, alert, oriented to person, place and time recent and remote memory intact normal attention and concentration language fluent, comprehension intact, naming intact fund of knowledge appropriate  CRANIAL NERVE:  2nd - no papilledema on fundoscopic exam 2nd, 3rd, 4th, 6th - pupils equal and reactive to light, visual fields full to confrontation, extraocular muscles intact, no nystagmus 5th - facial sensation symmetric 7th - facial strength symmetric 8th - hearing intact 9th - palate elevates symmetrically, uvula midline 11th - shoulder shrug symmetric 12th - tongue protrusion midline  MOTOR:  normal bulk and tone, full strength in the BUE, BLE; EXCEPT ATROPHY AND WEAKNESS OF BILATERAL APB  SENSORY:  normal and symmetric to light touch, pinprick, temperature, vibration; EXCEPT DECR IN HANDS AND FEET  COORDINATION:  finger-nose-finger, fine finger movements normal  REFLEXES:  deep tendon reflexes TRACE and symmetric  GAIT/STATION:  narrow based gait     DIAGNOSTIC DATA (LABS, IMAGING, TESTING) - I reviewed patient records, labs, notes, testing and imaging myself where available.  Lab Results  Component Value Date   WBC 6.8 06/09/2018   HGB 13.2 06/09/2018   HCT 39.1  06/09/2018   MCV 81.3 06/09/2018   PLT 204 06/09/2018      Component Value Date/Time   NA 136 09/19/2016 0551   K 4.7 09/19/2016 0551   CL 100 (L) 09/19/2016 0551   CO2 28 09/19/2016 0551   GLUCOSE 157 (H) 09/19/2016 0551   BUN 17 09/19/2016 0551   CREATININE 1.00 09/20/2016 0544   CALCIUM 9.2 09/19/2016 0551   PROT 8.5 (H) 09/15/2016 2148   ALBUMIN 3.8 09/15/2016 2148   AST 30 09/15/2016 2148   ALT 32 09/15/2016 2148   ALKPHOS 72 09/15/2016 2148   BILITOT 0.6 09/15/2016 2148   GFRNONAA >60 09/20/2016 0544   GFRAA >60 09/20/2016 0544   No results found for: "CHOL", "HDL", "LDLCALC", "LDLDIRECT", "TRIG", "CHOLHDL" Lab Results  Component Value Date   HGBA1C 7.5 (H) 09/19/2016   No results found for: "VITAMINB12" Lab Results  Component Value Date   TSH 0.762 09/16/2016    A1C 6.5    ASSESSMENT AND PLAN  71 y.o. year old male here with:   Dx:  1. Pain in both hands   2. Numbness and tingling in both hands   3. Bilateral carpal tunnel syndrome   4. Diabetic polyneuropathy associated with type 2 diabetes mellitus ([place])   5. Hand arthritis      PLAN:  PAIN / NUMBNESS IN HANDS (longstanding for more than 10 years, likely related to underlying diabetic neuropathy; more recently having pain and stiffness in hands, possibly related to arthritis and tendinitis.  Also with superimposed carpal tunnel syndrome bilaterally.) - recommend to try wrist splints at night time - consider hand surgery consult for CTS, but not sure of benefit due to underlying neuropathy and arthritis issues  Return for return to PCP, pending if symptoms worsen or fail to improve.    Penni Bombard, MD 37/03/6965, 89:38 AM Certified in Neurology, Neurophysiology and Neuroimaging  [facility] [address] [place],  [address] [phone]

## 2021-12-21 DIAGNOSIS — G4733 Obstructive sleep apnea (adult) (pediatric): Secondary | ICD-10-CM | POA: Diagnosis not present

## 2022-01-03 DIAGNOSIS — R0902 Hypoxemia: Secondary | ICD-10-CM | POA: Diagnosis not present

## 2022-01-03 DIAGNOSIS — I10 Essential (primary) hypertension: Secondary | ICD-10-CM | POA: Diagnosis not present

## 2022-01-03 DIAGNOSIS — G4733 Obstructive sleep apnea (adult) (pediatric): Secondary | ICD-10-CM | POA: Diagnosis not present

## 2022-01-06 DIAGNOSIS — G4733 Obstructive sleep apnea (adult) (pediatric): Secondary | ICD-10-CM | POA: Diagnosis not present

## 2022-01-20 DIAGNOSIS — G4733 Obstructive sleep apnea (adult) (pediatric): Secondary | ICD-10-CM | POA: Diagnosis not present

## 2022-01-22 DIAGNOSIS — G4733 Obstructive sleep apnea (adult) (pediatric): Secondary | ICD-10-CM | POA: Diagnosis not present

## 2022-02-03 DIAGNOSIS — G4733 Obstructive sleep apnea (adult) (pediatric): Secondary | ICD-10-CM | POA: Diagnosis not present

## 2022-02-03 DIAGNOSIS — R0902 Hypoxemia: Secondary | ICD-10-CM | POA: Diagnosis not present

## 2022-02-03 DIAGNOSIS — I10 Essential (primary) hypertension: Secondary | ICD-10-CM | POA: Diagnosis not present

## 2022-02-19 DIAGNOSIS — G4733 Obstructive sleep apnea (adult) (pediatric): Secondary | ICD-10-CM | POA: Diagnosis not present

## 2022-03-03 DIAGNOSIS — M25561 Pain in right knee: Secondary | ICD-10-CM | POA: Diagnosis not present

## 2022-03-05 DIAGNOSIS — G4733 Obstructive sleep apnea (adult) (pediatric): Secondary | ICD-10-CM | POA: Diagnosis not present

## 2022-03-05 DIAGNOSIS — I10 Essential (primary) hypertension: Secondary | ICD-10-CM | POA: Diagnosis not present

## 2022-03-05 DIAGNOSIS — R0902 Hypoxemia: Secondary | ICD-10-CM | POA: Diagnosis not present

## 2022-04-05 DIAGNOSIS — G4733 Obstructive sleep apnea (adult) (pediatric): Secondary | ICD-10-CM | POA: Diagnosis not present

## 2022-04-07 DIAGNOSIS — G4733 Obstructive sleep apnea (adult) (pediatric): Secondary | ICD-10-CM | POA: Diagnosis not present

## 2022-04-15 DIAGNOSIS — Z6841 Body Mass Index (BMI) 40.0 and over, adult: Secondary | ICD-10-CM | POA: Diagnosis not present

## 2022-04-15 DIAGNOSIS — M109 Gout, unspecified: Secondary | ICD-10-CM | POA: Diagnosis not present

## 2022-04-15 DIAGNOSIS — I10 Essential (primary) hypertension: Secondary | ICD-10-CM | POA: Diagnosis not present

## 2022-04-15 DIAGNOSIS — Z8546 Personal history of malignant neoplasm of prostate: Secondary | ICD-10-CM | POA: Diagnosis not present

## 2022-04-15 DIAGNOSIS — E1149 Type 2 diabetes mellitus with other diabetic neurological complication: Secondary | ICD-10-CM | POA: Diagnosis not present

## 2022-04-15 DIAGNOSIS — E782 Mixed hyperlipidemia: Secondary | ICD-10-CM | POA: Diagnosis not present

## 2022-04-15 DIAGNOSIS — G629 Polyneuropathy, unspecified: Secondary | ICD-10-CM | POA: Diagnosis not present

## 2022-04-15 DIAGNOSIS — E1142 Type 2 diabetes mellitus with diabetic polyneuropathy: Secondary | ICD-10-CM | POA: Diagnosis not present

## 2022-04-21 DIAGNOSIS — G4733 Obstructive sleep apnea (adult) (pediatric): Secondary | ICD-10-CM | POA: Diagnosis not present

## 2022-05-06 DIAGNOSIS — I10 Essential (primary) hypertension: Secondary | ICD-10-CM | POA: Diagnosis not present

## 2022-05-06 DIAGNOSIS — R0902 Hypoxemia: Secondary | ICD-10-CM | POA: Diagnosis not present

## 2022-05-06 DIAGNOSIS — G4733 Obstructive sleep apnea (adult) (pediatric): Secondary | ICD-10-CM | POA: Diagnosis not present

## 2022-05-20 DIAGNOSIS — G4733 Obstructive sleep apnea (adult) (pediatric): Secondary | ICD-10-CM | POA: Diagnosis not present

## 2022-06-04 DIAGNOSIS — R0902 Hypoxemia: Secondary | ICD-10-CM | POA: Diagnosis not present

## 2022-06-04 DIAGNOSIS — I10 Essential (primary) hypertension: Secondary | ICD-10-CM | POA: Diagnosis not present

## 2022-06-04 DIAGNOSIS — G4733 Obstructive sleep apnea (adult) (pediatric): Secondary | ICD-10-CM | POA: Diagnosis not present

## 2022-06-20 DIAGNOSIS — G4733 Obstructive sleep apnea (adult) (pediatric): Secondary | ICD-10-CM | POA: Diagnosis not present

## 2022-07-03 DIAGNOSIS — M1712 Unilateral primary osteoarthritis, left knee: Secondary | ICD-10-CM | POA: Diagnosis not present

## 2022-07-05 DIAGNOSIS — R0902 Hypoxemia: Secondary | ICD-10-CM | POA: Diagnosis not present

## 2022-07-05 DIAGNOSIS — I10 Essential (primary) hypertension: Secondary | ICD-10-CM | POA: Diagnosis not present

## 2022-07-05 DIAGNOSIS — G4733 Obstructive sleep apnea (adult) (pediatric): Secondary | ICD-10-CM | POA: Diagnosis not present

## 2022-08-04 DIAGNOSIS — G4733 Obstructive sleep apnea (adult) (pediatric): Secondary | ICD-10-CM | POA: Diagnosis not present

## 2022-08-04 DIAGNOSIS — R0902 Hypoxemia: Secondary | ICD-10-CM | POA: Diagnosis not present

## 2022-08-04 DIAGNOSIS — I10 Essential (primary) hypertension: Secondary | ICD-10-CM | POA: Diagnosis not present

## 2022-08-14 DIAGNOSIS — M170 Bilateral primary osteoarthritis of knee: Secondary | ICD-10-CM | POA: Diagnosis not present

## 2022-08-24 DIAGNOSIS — I10 Essential (primary) hypertension: Secondary | ICD-10-CM | POA: Diagnosis not present

## 2022-08-24 DIAGNOSIS — E119 Type 2 diabetes mellitus without complications: Secondary | ICD-10-CM | POA: Diagnosis not present

## 2022-08-24 DIAGNOSIS — J029 Acute pharyngitis, unspecified: Secondary | ICD-10-CM | POA: Diagnosis not present

## 2022-09-01 DIAGNOSIS — M170 Bilateral primary osteoarthritis of knee: Secondary | ICD-10-CM | POA: Diagnosis not present

## 2022-09-04 DIAGNOSIS — G4733 Obstructive sleep apnea (adult) (pediatric): Secondary | ICD-10-CM | POA: Diagnosis not present

## 2022-09-04 DIAGNOSIS — R0902 Hypoxemia: Secondary | ICD-10-CM | POA: Diagnosis not present

## 2022-09-04 DIAGNOSIS — I10 Essential (primary) hypertension: Secondary | ICD-10-CM | POA: Diagnosis not present

## 2022-09-05 DIAGNOSIS — J189 Pneumonia, unspecified organism: Secondary | ICD-10-CM | POA: Diagnosis not present

## 2022-09-05 DIAGNOSIS — R051 Acute cough: Secondary | ICD-10-CM | POA: Diagnosis not present

## 2022-09-08 DIAGNOSIS — M170 Bilateral primary osteoarthritis of knee: Secondary | ICD-10-CM | POA: Diagnosis not present

## 2022-09-15 DIAGNOSIS — M170 Bilateral primary osteoarthritis of knee: Secondary | ICD-10-CM | POA: Diagnosis not present

## 2022-10-04 DIAGNOSIS — I10 Essential (primary) hypertension: Secondary | ICD-10-CM | POA: Diagnosis not present

## 2022-10-04 DIAGNOSIS — G4733 Obstructive sleep apnea (adult) (pediatric): Secondary | ICD-10-CM | POA: Diagnosis not present

## 2022-10-04 DIAGNOSIS — R0902 Hypoxemia: Secondary | ICD-10-CM | POA: Diagnosis not present

## 2022-10-15 DIAGNOSIS — E1121 Type 2 diabetes mellitus with diabetic nephropathy: Secondary | ICD-10-CM | POA: Diagnosis not present

## 2022-10-15 DIAGNOSIS — I10 Essential (primary) hypertension: Secondary | ICD-10-CM | POA: Diagnosis not present

## 2022-10-15 DIAGNOSIS — Z1331 Encounter for screening for depression: Secondary | ICD-10-CM | POA: Diagnosis not present

## 2022-10-15 DIAGNOSIS — Z8546 Personal history of malignant neoplasm of prostate: Secondary | ICD-10-CM | POA: Diagnosis not present

## 2022-10-15 DIAGNOSIS — E1142 Type 2 diabetes mellitus with diabetic polyneuropathy: Secondary | ICD-10-CM | POA: Diagnosis not present

## 2022-10-15 DIAGNOSIS — M109 Gout, unspecified: Secondary | ICD-10-CM | POA: Diagnosis not present

## 2022-10-15 DIAGNOSIS — Z0000 Encounter for general adult medical examination without abnormal findings: Secondary | ICD-10-CM | POA: Diagnosis not present

## 2022-10-15 DIAGNOSIS — E782 Mixed hyperlipidemia: Secondary | ICD-10-CM | POA: Diagnosis not present

## 2022-10-15 DIAGNOSIS — E1149 Type 2 diabetes mellitus with other diabetic neurological complication: Secondary | ICD-10-CM | POA: Diagnosis not present

## 2022-10-19 DIAGNOSIS — G4733 Obstructive sleep apnea (adult) (pediatric): Secondary | ICD-10-CM | POA: Diagnosis not present

## 2022-11-04 DIAGNOSIS — R0902 Hypoxemia: Secondary | ICD-10-CM | POA: Diagnosis not present

## 2022-11-04 DIAGNOSIS — I10 Essential (primary) hypertension: Secondary | ICD-10-CM | POA: Diagnosis not present

## 2022-11-04 DIAGNOSIS — G4733 Obstructive sleep apnea (adult) (pediatric): Secondary | ICD-10-CM | POA: Diagnosis not present

## 2022-11-19 DIAGNOSIS — G4733 Obstructive sleep apnea (adult) (pediatric): Secondary | ICD-10-CM | POA: Diagnosis not present

## 2022-11-28 ENCOUNTER — Emergency Department (HOSPITAL_BASED_OUTPATIENT_CLINIC_OR_DEPARTMENT_OTHER): Payer: Medicare Other | Admitting: Radiology

## 2022-11-28 ENCOUNTER — Emergency Department (HOSPITAL_BASED_OUTPATIENT_CLINIC_OR_DEPARTMENT_OTHER)
Admission: EM | Admit: 2022-11-28 | Discharge: 2022-11-28 | Disposition: A | Discharge status: Home / Self Care | Payer: Medicare Other | Attending: Emergency Medicine | Admitting: Emergency Medicine

## 2022-11-28 ENCOUNTER — Encounter (HOSPITAL_BASED_OUTPATIENT_CLINIC_OR_DEPARTMENT_OTHER): Payer: Self-pay

## 2022-11-28 ENCOUNTER — Other Ambulatory Visit: Payer: Self-pay

## 2022-11-28 DIAGNOSIS — Z79899 Other long term (current) drug therapy: Secondary | ICD-10-CM | POA: Insufficient documentation

## 2022-11-28 DIAGNOSIS — Z7982 Long term (current) use of aspirin: Secondary | ICD-10-CM | POA: Diagnosis not present

## 2022-11-28 DIAGNOSIS — X58XXXA Exposure to other specified factors, initial encounter: Secondary | ICD-10-CM | POA: Diagnosis not present

## 2022-11-28 DIAGNOSIS — M7731 Calcaneal spur, right foot: Secondary | ICD-10-CM | POA: Diagnosis not present

## 2022-11-28 DIAGNOSIS — E119 Type 2 diabetes mellitus without complications: Secondary | ICD-10-CM | POA: Insufficient documentation

## 2022-11-28 DIAGNOSIS — S91331A Puncture wound without foreign body, right foot, initial encounter: Secondary | ICD-10-CM | POA: Insufficient documentation

## 2022-11-28 DIAGNOSIS — Z7984 Long term (current) use of oral hypoglycemic drugs: Secondary | ICD-10-CM | POA: Insufficient documentation

## 2022-11-28 DIAGNOSIS — M19071 Primary osteoarthritis, right ankle and foot: Secondary | ICD-10-CM | POA: Diagnosis not present

## 2022-11-28 DIAGNOSIS — M7989 Other specified soft tissue disorders: Secondary | ICD-10-CM | POA: Diagnosis not present

## 2022-11-28 DIAGNOSIS — M79671 Pain in right foot: Secondary | ICD-10-CM | POA: Diagnosis not present

## 2022-11-28 MED ORDER — CIPROFLOXACIN HCL 500 MG PO TABS: 500.0000 mg | ORAL_TABLET | Freq: Two times a day (BID) | ORAL | 0 refills | Status: DC

## 2022-11-28 MED ORDER — CEPHALEXIN 500 MG PO CAPS: 500.0000 mg | ORAL_CAPSULE | Freq: Four times a day (QID) | ORAL | 0 refills | Status: DC

## 2022-11-28 MED ORDER — CEPHALEXIN 250 MG PO CAPS
500.0000 mg | ORAL_CAPSULE | Freq: Once | ORAL | Status: AC
  Administered 2022-11-28: 500 mg via ORAL
  Filled 2022-11-28: qty 2

## 2022-11-28 MED ORDER — CIPROFLOXACIN HCL 500 MG PO TABS
500.0000 mg | ORAL_TABLET | Freq: Once | ORAL | Status: AC
  Administered 2022-11-28: 500 mg via ORAL
  Filled 2022-11-28: qty 1

## 2022-11-28 NOTE — ED Notes (Signed)
Dry dressing applied to right foot, dressing change discussed with pt and wife. Voiced understanding. Dc instructions reviewed with patient. Patient voiced understanding. Dc with belongings.

## 2022-11-28 NOTE — ED Provider Notes (Signed)
[place] EMERGENCY DEPARTMENT AT [facility] Provider Note   CSN: [id number] Arrival date & time: 11/28/22  1442     History  Chief Complaint  Patient presents with   right foot wound    Glenn Herrera is a 72 y.o. male.  Patient with history of T2DM with significant neuropathy presents from Urgent Care for further care of foot wound. He stepped on a nail yesterday that went through his shoe but was unaware of it until later in the evening when he got home. Today the foot is red and swollen. He states he has mild pain in the lateral aspect of the foot. No extension of symptoms to the lower leg. No fever or systemic symptoms.   The history is provided by the patient and the spouse. No language interpreter was used.       Home Medications Prior to Admission medications   Medication Sig Start Date End Date Taking? Authorizing Provider  cephALEXin (KEFLEX) 500 MG capsule Take 1 capsule (500 mg total) by mouth 4 (four) times daily. 11/28/22  Yes Syniyah Bourne, Melvenia Beam, PA-C  ciprofloxacin (CIPRO) 500 MG tablet Take 1 tablet (500 mg total) by mouth every 12 (twelve) hours. 11/28/22  Yes Elpidio Anis, PA-C  acetaminophen (TYLENOL) 325 MG tablet Take 1 tablet (325 mg total) by mouth every 6 (six) hours as needed. Patient not taking: Reported on 12/15/2021 09/20/16   Albertine Grates, MD  allopurinol (ZYLOPRIM) 300 MG tablet Take 300 mg by mouth at bedtime.    [provider]  aspirin 325 MG tablet Take 325 mg by mouth at bedtime.    [provider]  benazepril-hydrochlorthiazide (LOTENSIN HCT) 20-12.5 MG per tablet Take 1 tablet by mouth at bedtime. Patient not taking: Reported on 12/15/2021    [provider]  clindamycin (CLEOCIN) 300 MG capsule Take 1 capsule (300 mg total) by mouth 3 (three) times daily. Patient not taking: Reported on 12/15/2021 06/15/18   Vivi Barrack, DPM  doxycycline (VIBRA-TABS) 100 MG tablet Take 1 tablet (100 mg total) by mouth 2 (two)  times daily. Patient not taking: Reported on 12/15/2021 03/21/19   Vivi Barrack, DPM  glimepiride (AMARYL) 2 MG tablet Take 2 mg by mouth every evening.    [provider]  HYDROcodone-acetaminophen (NORCO/VICODIN) 5-325 MG tablet Take 1 tablet by mouth every 6 (six) hours as needed for moderate pain or severe pain. Patient not taking: Reported on 12/15/2021 09/20/16   Albertine Grates, MD  metFORMIN (GLUCOPHAGE-XR) 500 MG 24 hr tablet Take 2 tablets (1,000 mg total) by mouth 2 times daily at 12 noon and 4 pm. Resume as before and restart at bedtime tonight, 02/07/16. 05/23/16   Elvina Sidle, MD  omeprazole (PRILOSEC) 20 MG capsule Take 20 mg by mouth daily.    [provider]  pioglitazone (ACTOS) 15 MG tablet TAKE 1 TABLET BY MOUTH ONCE DAILY FOR DIABETES 09/06/18   [provider]  polyethylene glycol (MIRALAX / GLYCOLAX) packet Take 17 g by mouth daily. Patient not taking: Reported on 12/15/2021 09/21/16   Albertine Grates, MD  saccharomyces boulardii (FLORASTOR) 250 MG capsule Take 1 capsule (250 mg total) by mouth 2 (two) times daily. Patient not taking: Reported on 12/15/2021 09/20/16   Albertine Grates, MD  senna-docusate (SENOKOT-S) 8.6-50 MG tablet Take 1 tablet by mouth at bedtime. Patient not taking: Reported on 12/15/2021 09/20/16   Albertine Grates, MD  simvastatin (ZOCOR) 20 MG tablet Take 20 mg by mouth at bedtime. Patient  not taking: Reported on 12/15/2021    [provider]  valsartan-hydrochlorothiazide (DIOVAN-HCT) 160-12.5 MG tablet Take 1 tablet by mouth daily. 11/29/21   [provider]      Allergies    Patient has no known allergies.    Review of Systems   Review of Systems  Physical Exam Updated Vital Signs BP (!) 134/58 (BP Location: Right Arm)   Pulse 80   Temp 98 F (36.7 C) (Oral)   Resp 18   SpO2 98%  Physical Exam Vitals and nursing note reviewed.  Constitutional:      Appearance: Normal appearance. He is obese.  Musculoskeletal:      Comments: Right foot swollen mildly, plantar surface > dorsal. There is redness over the dorsal foot without palpable warmth. There is a small ulceration to planter mid-foot without drainage. No foreign body visualized or palpated.   Skin:    General: Skin is warm and dry.     Findings: Erythema present.  Neurological:     Mental Status: He is alert and oriented to person, place, and time.     Sensory: Sensory deficit present.     ED Results / Procedures / Treatments   Labs (all labs ordered are listed, but only abnormal results are displayed) Labs Reviewed - No data to display  EKG None  Radiology DG Foot Complete Right  Result Date: 11/28/2022 CLINICAL DATA:  Right foot pain, diabetes EXAM: RIGHT FOOT COMPLETE - 3+ VIEW COMPARISON:  06/02/2021, puncture wound FINDINGS: There is sclerosis, asymmetric joint space narrowing and osteophyte formation superimposed upon a Lisfranc divergent dislocation in keeping with remote Lisfranc injury and secondary degenerative arthritis of the midfoot suggestive of early changes of a Charcot joint. Advanced degenerative changes are noted within the tibiotalar and subtalar articulations, not well profiled on this examination. Moderate degenerative arthritis of the first metatarsophalangeal joint. Moderate superior and plantar calcaneal spurs are present. No acute fracture. There is soft tissue swelling involving the plantar soft tissues of the midfoot. No retained radiopaque foreign body. IMPRESSION: 1. Soft tissue swelling involving the plantar soft tissues of the midfoot. No retained radiopaque foreign body. 2. Remote Lisfranc injury with secondary degenerative arthritis of the midfoot suggestive of early changes of a Charcot joint. 3. 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 Electronically Signed   By: Helyn Numbers M.D.   On: 11/28/2022 17:12    Procedures Procedures     Medications Ordered in ED Medications  ciprofloxacin (CIPRO) tablet 500 mg (500 mg Oral Given 11/28/22 1803)  cephALEXin (KEFLEX) capsule 500 mg (500 mg Oral Given 11/28/22 1804)    ED Course/ Medical Decision Making/ A&P Clinical Course as of 11/28/22 1818  Sat Nov 28, 2022  1813 Patient with history of T2DM and significant loss of feeling in feet secondary to neuropathy. Foot wound yesterday, with swelling and redness today. Afebrile. VSS. Imaging rules out retained foreign body. Feel labs will not contribute to care plan. Will start abx for pseudomonas coverage and strongly encourage recheck in 72 hours. He is established with podiatric care as well as PCP. Return precautions discussed.  [SU]    Clinical Course User Index [SU] Elpidio Anis, PA-C                                 Medical Decision Making Amount and/or Complexity of Data Reviewed Radiology: ordered.  Risk  Prescription drug management.           Final Clinical Impression(s) / ED Diagnoses Final diagnoses:  Penetrating wound of right foot, initial encounter    Rx / DC Orders ED Discharge Orders          Ordered    cephALEXin (KEFLEX) 500 MG capsule  4 times daily        11/28/22 1817    ciprofloxacin (CIPRO) 500 MG tablet  Every 12 hours        11/28/22 1817              Elpidio Anis, PA-C 11/28/22 1818    Curatolo, Adam, DO 11/28/22 2004

## 2022-11-28 NOTE — Discharge Instructions (Signed)
Follow up with Dr. Manus Gunning or with your podiatrist in 72 hours for recheck of foot wound. If you develop a fever, significantly worse swelling, pain or drainage from the wound, please return to the emergency department immediately.

## 2022-11-28 NOTE — ED Triage Notes (Signed)
He states he works in [employer]. He tells me he "found a nail" in plantar aspect of right foot yesterday. He is diabetic and has limited feeling in his feet, therefore, he is not sure how long he had the nail in his foot. He was seen at Urgent care yesterday. He is here with persistent swelling and redness of right foot. He is ambulatory and in no distress. He denies fever, nor any other sign of current illness.

## 2022-11-30 ENCOUNTER — Encounter: Payer: Self-pay | Admitting: Podiatry

## 2022-11-30 ENCOUNTER — Ambulatory Visit: Payer: Medicare Other | Admitting: Podiatry

## 2022-11-30 ENCOUNTER — Ambulatory Visit (INDEPENDENT_AMBULATORY_CARE_PROVIDER_SITE_OTHER): Payer: Medicare Other

## 2022-11-30 DIAGNOSIS — E11610 Type 2 diabetes mellitus with diabetic neuropathic arthropathy: Secondary | ICD-10-CM | POA: Diagnosis not present

## 2022-11-30 DIAGNOSIS — L089 Local infection of the skin and subcutaneous tissue, unspecified: Secondary | ICD-10-CM | POA: Diagnosis not present

## 2022-11-30 DIAGNOSIS — L03115 Cellulitis of right lower limb: Secondary | ICD-10-CM

## 2022-11-30 DIAGNOSIS — S99921S Unspecified injury of right foot, sequela: Secondary | ICD-10-CM

## 2022-11-30 DIAGNOSIS — M1460 Charcot's joint, unspecified site: Secondary | ICD-10-CM | POA: Diagnosis not present

## 2022-11-30 DIAGNOSIS — L02619 Cutaneous abscess of unspecified foot: Secondary | ICD-10-CM

## 2022-11-30 DIAGNOSIS — L02611 Cutaneous abscess of right foot: Secondary | ICD-10-CM | POA: Diagnosis not present

## 2022-11-30 DIAGNOSIS — M79671 Pain in right foot: Secondary | ICD-10-CM

## 2022-11-30 DIAGNOSIS — L03119 Cellulitis of unspecified part of limb: Secondary | ICD-10-CM | POA: Diagnosis not present

## 2022-11-30 NOTE — Progress Notes (Signed)
Subjective:   Patient ID: Glenn Herrera, male   DOB: 72 y.o.   MRN: [id number]   HPI Patient stepped on a nail approximately 3 to 4 days ago and has had some swelling in his foot and has been to urgent care emergency room family doctor and is currently on 3 antibiotics.   ROS      Objective:  Physical Exam  Neurovascular status found to be intact with diminishment sharp dull vibratory with edema in the right medial arch localized and when I asked him he states this is the way it typically is no proximal edema erythema or drainage noted currently no systemic signs of infection no increased temperature or other issues that have occurred.  On the plantar aspect of the right arch there is a crusted area consistent with the trauma with the possibility of a cellulitic event surrounding this     Assessment:  Very difficult to make complete determination as to whether or not this is a normal localized process or there may possibly be infection present cellulitis     Plan:  H&P Sharp sterile debridement of the area accomplished no odor no obvious pus formation but I did open the area up I probed it and I did do a culture for evaluation.  Begin soaks I explained elevation continue all 3 antibiotics patient is taking and if any systemic signs of infection or any change in swelling or redness patient is to go straight to the emergency room may require IV antibiotics may require to have the area opened up  X-rays were negative for signs that are that there is what appears to be bone infection it appears to be soft tissue and again difficult to make complete determination

## 2022-12-03 DIAGNOSIS — M170 Bilateral primary osteoarthritis of knee: Secondary | ICD-10-CM | POA: Diagnosis not present

## 2022-12-05 DIAGNOSIS — G4733 Obstructive sleep apnea (adult) (pediatric): Secondary | ICD-10-CM | POA: Diagnosis not present

## 2022-12-05 DIAGNOSIS — I10 Essential (primary) hypertension: Secondary | ICD-10-CM | POA: Diagnosis not present

## 2022-12-05 DIAGNOSIS — R0902 Hypoxemia: Secondary | ICD-10-CM | POA: Diagnosis not present

## 2022-12-19 DIAGNOSIS — G4733 Obstructive sleep apnea (adult) (pediatric): Secondary | ICD-10-CM | POA: Diagnosis not present

## 2023-01-04 DIAGNOSIS — R0902 Hypoxemia: Secondary | ICD-10-CM | POA: Diagnosis not present

## 2023-01-04 DIAGNOSIS — G4733 Obstructive sleep apnea (adult) (pediatric): Secondary | ICD-10-CM | POA: Diagnosis not present

## 2023-01-04 DIAGNOSIS — I10 Essential (primary) hypertension: Secondary | ICD-10-CM | POA: Diagnosis not present

## 2023-01-13 DIAGNOSIS — J40 Bronchitis, not specified as acute or chronic: Secondary | ICD-10-CM | POA: Diagnosis not present

## 2023-01-22 DIAGNOSIS — G4733 Obstructive sleep apnea (adult) (pediatric): Secondary | ICD-10-CM | POA: Diagnosis not present

## 2023-02-04 DIAGNOSIS — G4733 Obstructive sleep apnea (adult) (pediatric): Secondary | ICD-10-CM | POA: Diagnosis not present

## 2023-02-04 DIAGNOSIS — R0902 Hypoxemia: Secondary | ICD-10-CM | POA: Diagnosis not present

## 2023-02-04 DIAGNOSIS — I10 Essential (primary) hypertension: Secondary | ICD-10-CM | POA: Diagnosis not present

## 2023-02-07 DIAGNOSIS — E119 Type 2 diabetes mellitus without complications: Secondary | ICD-10-CM | POA: Diagnosis not present

## 2023-02-21 DIAGNOSIS — G4733 Obstructive sleep apnea (adult) (pediatric): Secondary | ICD-10-CM | POA: Diagnosis not present

## 2023-03-06 DIAGNOSIS — R0902 Hypoxemia: Secondary | ICD-10-CM | POA: Diagnosis not present

## 2023-03-06 DIAGNOSIS — G4733 Obstructive sleep apnea (adult) (pediatric): Secondary | ICD-10-CM | POA: Diagnosis not present

## 2023-03-06 DIAGNOSIS — I10 Essential (primary) hypertension: Secondary | ICD-10-CM | POA: Diagnosis not present

## 2023-03-10 DIAGNOSIS — E119 Type 2 diabetes mellitus without complications: Secondary | ICD-10-CM | POA: Diagnosis not present

## 2023-03-24 DIAGNOSIS — G4733 Obstructive sleep apnea (adult) (pediatric): Secondary | ICD-10-CM | POA: Diagnosis not present

## 2023-04-01 DIAGNOSIS — M170 Bilateral primary osteoarthritis of knee: Secondary | ICD-10-CM | POA: Diagnosis not present

## 2023-04-06 DIAGNOSIS — R0902 Hypoxemia: Secondary | ICD-10-CM | POA: Diagnosis not present

## 2023-04-06 DIAGNOSIS — I10 Essential (primary) hypertension: Secondary | ICD-10-CM | POA: Diagnosis not present

## 2023-04-06 DIAGNOSIS — G4733 Obstructive sleep apnea (adult) (pediatric): Secondary | ICD-10-CM | POA: Diagnosis not present

## 2023-04-08 DIAGNOSIS — M170 Bilateral primary osteoarthritis of knee: Secondary | ICD-10-CM | POA: Diagnosis not present

## 2023-04-10 DIAGNOSIS — E119 Type 2 diabetes mellitus without complications: Secondary | ICD-10-CM | POA: Diagnosis not present

## 2023-04-14 DIAGNOSIS — I10 Essential (primary) hypertension: Secondary | ICD-10-CM | POA: Diagnosis not present

## 2023-04-14 DIAGNOSIS — D509 Iron deficiency anemia, unspecified: Secondary | ICD-10-CM | POA: Diagnosis not present

## 2023-04-14 DIAGNOSIS — E118 Type 2 diabetes mellitus with unspecified complications: Secondary | ICD-10-CM | POA: Diagnosis not present

## 2023-04-14 DIAGNOSIS — E782 Mixed hyperlipidemia: Secondary | ICD-10-CM | POA: Diagnosis not present

## 2023-04-14 DIAGNOSIS — M109 Gout, unspecified: Secondary | ICD-10-CM | POA: Diagnosis not present

## 2023-04-14 DIAGNOSIS — Z8546 Personal history of malignant neoplasm of prostate: Secondary | ICD-10-CM | POA: Diagnosis not present

## 2023-04-14 DIAGNOSIS — M170 Bilateral primary osteoarthritis of knee: Secondary | ICD-10-CM | POA: Diagnosis not present

## 2023-04-27 DIAGNOSIS — G5603 Carpal tunnel syndrome, bilateral upper limbs: Secondary | ICD-10-CM | POA: Diagnosis not present

## 2023-05-07 DIAGNOSIS — I10 Essential (primary) hypertension: Secondary | ICD-10-CM | POA: Diagnosis not present

## 2023-05-07 DIAGNOSIS — R0902 Hypoxemia: Secondary | ICD-10-CM | POA: Diagnosis not present

## 2023-05-07 DIAGNOSIS — G4733 Obstructive sleep apnea (adult) (pediatric): Secondary | ICD-10-CM | POA: Diagnosis not present

## 2023-05-08 DIAGNOSIS — E119 Type 2 diabetes mellitus without complications: Secondary | ICD-10-CM | POA: Diagnosis not present

## 2023-05-12 DIAGNOSIS — E118 Type 2 diabetes mellitus with unspecified complications: Secondary | ICD-10-CM | POA: Diagnosis not present

## 2023-05-15 DIAGNOSIS — B349 Viral infection, unspecified: Secondary | ICD-10-CM | POA: Diagnosis not present

## 2023-05-19 ENCOUNTER — Other Ambulatory Visit: Payer: Self-pay

## 2023-05-19 DIAGNOSIS — L03115 Cellulitis of right lower limb: Secondary | ICD-10-CM

## 2023-05-28 ENCOUNTER — Ambulatory Visit (HOSPITAL_COMMUNITY)
Admission: RE | Admit: 2023-05-28 | Discharge: 2023-05-28 | Disposition: A | Discharge status: Home / Self Care | Payer: Medicare Other | Source: Ambulatory Visit | Attending: Vascular Surgery | Admitting: Vascular Surgery

## 2023-05-28 ENCOUNTER — Ambulatory Visit: Payer: Medicare Other | Admitting: Physician Assistant

## 2023-05-28 VITALS — HR 70 | Temp 98.3°F | Ht 69.0 in | Wt 276.4 lb

## 2023-05-28 DIAGNOSIS — M7989 Other specified soft tissue disorders: Secondary | ICD-10-CM

## 2023-05-28 DIAGNOSIS — I872 Venous insufficiency (chronic) (peripheral): Secondary | ICD-10-CM

## 2023-05-28 DIAGNOSIS — L03115 Cellulitis of right lower limb: Secondary | ICD-10-CM | POA: Diagnosis not present

## 2023-05-28 NOTE — Progress Notes (Signed)
 Requested by:  Debroah Loop, DO [address] [place],  [place] [address]  Reason for consultation: Recurrent venous ulcerations   History of Present Illness   Glenn Herrera is a 73 y.o. (05-06-1950) male who presents for evaluation of venous insufficiency.  He has dealt with bilateral lower extremity swelling, right greater than left, for several years now.  He states his swelling has slowly gotten worse over the past couple of years.  He says his leg swelling is usually absent at the beginning of the day and worsens throughout the day.  He denies any associated discomfort or pain.  He does endorse recurrent, small venous ulcers and "water blisters" around his right shin.  These typically take around 1 to 2 weeks to heal.  He denies any issues with infection of these ulcerations.  He has not required Unna boot placement before.  He says that he usually "just waits for the blisters to pop and go away".  He also notes significant purple discoloration of his feet while sitting down, which goes away when he is laying down.  He has no prior history of venous procedures.  He has no prior history of DVT.  He does not wear compression stockings or elevate his legs.  Past Medical History:  Diagnosis Date   Cancer (HCC)    prostate    Diabetes mellitus without complication (HCC)    GERD (gastroesophageal reflux disease)    Gout    High blood cholesterol    HLD (hyperlipidemia)    Hypertension    OSA on CPAP    Peripheral neuropathy    Prostate cancer [facility])     Past Surgical History:  Procedure Laterality Date   CATARACT EXTRACTION Bilateral 02/2009   3/82015   LAPAROSCOPIC APPENDECTOMY N/A 02/06/2016   Procedure: APPENDECTOMY LAPAROSCOPIC;  Surgeon: Glenna Fellows, MD;  Location: [facility];  Service: General;  Laterality: N/A;   LAPAROSCOPIC APPENDECTOMY  01/2016   PROSTATE SURGERY  2002    Social History   Socioeconomic History   Marital status: Married    Spouse  name: Not on file   Number of children: 2   Years of education: Not on file   Highest education level: Not on file  Occupational History   Not on file  Tobacco Use   Smoking status: Never   Smokeless tobacco: Never  Substance and Sexual Activity   Alcohol use: No   Drug use: No   Sexual activity: Not on file  Other Topics Concern   Not on file  Social History Narrative   Lives with wife   Caffeine 32-64 oz daily   Social Drivers of Health   Financial Resource Strain: Not on file  Food Insecurity: Not on file  Transportation Needs: Not on file  Physical Activity: Not on file  Stress: Not on file  Social Connections: Not on file  Intimate Partner Violence: Not on file    Family History  Problem Relation Age of Onset   Diabetes Mother    Hypertension Mother    Diabetes Mellitus II Mother    Gout Mother    Sleep apnea Mother    Hypertension Father    Sleep apnea Brother    Diabetes Mellitus II Brother     Current Outpatient Medications  Medication Sig Dispense Refill   allopurinol (ZYLOPRIM) 300 MG tablet Take 300 mg by mouth at bedtime.     aspirin 325 MG tablet Take 325 mg by mouth  at bedtime.     glimepiride (AMARYL) 2 MG tablet Take 2 mg by mouth every evening.     metFORMIN (GLUCOPHAGE-XR) 500 MG 24 hr tablet Take 2 tablets (1,000 mg total) by mouth 2 times daily at 12 noon and 4 pm. Resume as before and restart at bedtime tonight, 02/07/16.     omeprazole (PRILOSEC) 20 MG capsule Take 20 mg by mouth daily.     OZEMPIC, 0.25 OR 0.5 MG/DOSE, 2 MG/3ML SOPN Inject 0.25 mg into the skin once a week.     pioglitazone (ACTOS) 15 MG tablet TAKE 1 TABLET BY MOUTH ONCE DAILY FOR DIABETES     valsartan-hydrochlorothiazide (DIOVAN-HCT) 160-12.5 MG tablet Take 1 tablet by mouth daily.     No current facility-administered medications for this visit.    No Known Allergies  REVIEW OF SYSTEMS (negative unless checked):   Cardiac:  []  Chest pain or chest pressure? []   Shortness of breath upon activity? []  Shortness of breath when lying flat? []  Irregular heart rhythm?  Vascular:  []  Pain in calf, thigh, or hip brought on by walking? []  Pain in feet at night that wakes you up from your sleep? []  Blood clot in your veins? [x]  Leg swelling?  Pulmonary:  []  Oxygen at home? []  Productive cough? []  Wheezing?  Neurologic:  []  Sudden weakness in arms or legs? []  Sudden numbness in arms or legs? []  Sudden onset of difficult speaking or slurred speech? []  Temporary loss of vision in one eye? []  Problems with dizziness?  Gastrointestinal:  []  Blood in stool? []  Vomited blood?  Genitourinary:  []  Burning when urinating? []  Blood in urine?  Psychiatric:  []  Major depression  Hematologic:  []  Bleeding problems? []  Problems with blood clotting?  Dermatologic:  [x]  Rashes or ulcers?  Constitutional:  []  Fever or chills?  Ear/Nose/Throat:  []  Change in hearing? []  Nose bleeds? []  Sore throat?  Musculoskeletal:  []  Back pain? []  Joint pain? []  Muscle pain?   Physical Examination     Vitals:   05/28/23 1035  Pulse: 70  Temp: 98.3 F (36.8 C)  TempSrc: Temporal  SpO2: 96%  Weight: 276 lb 6.4 oz (125.4 kg)  Height: 5\' 9"  (1.753 m)   Body mass index is 40.82 kg/m.  General:  WDWN in NAD; vital signs documented above Gait: Not observed HENT: WNL, normocephalic Pulmonary: normal non-labored breathing , without Rales, rhonchi,  wheezing Cardiac: regular Abdomen: soft, NT, no masses Skin: without rashes Vascular Exam/Pulses: 1+ DP pulses Extremities: Several scattered spider/reticular veins on bilateral lower legs and ankles.  Slight stasis pigmentation of bilateral ankles.  Purple appearance to both feet.  No edema of LLE.  1+ edema of RLE. Three small healed shin ulcerations with surrounding hyperpigmentation on the right Musculoskeletal: no muscle wasting or atrophy  Neurologic: A&O X 3;  No focal weakness or paresthesias  are detected Psychiatric:  The pt has Normal affect.  Non-invasive Vascular Imaging   RLE Venous Insufficiency Duplex (05/28/2023):  RIGHT         Reflux NoRefluxReflux TimeDiameter cmsComments                          Yes                                   +--------------+---------+------+-----------+------------+--------+  CFV  yes   >1 second                       +--------------+---------+------+-----------+------------+--------+  FV prox       no                                              +--------------+---------+------+-----------+------------+--------+  FV mid        no                                              +--------------+---------+------+-----------+------------+--------+  FV dist       no                                              +--------------+---------+------+-----------+------------+--------+  Popliteal    no                                              +--------------+---------+------+-----------+------------+--------+  GSV at [facility]    no                            0.66              +--------------+---------+------+-----------+------------+--------+  GSV prox thighno                            0.23              +--------------+---------+------+-----------+------------+--------+  GSV mid thigh no                            0.19              +--------------+---------+------+-----------+------------+--------+  GSV dist thighno                            0.24              +--------------+---------+------+-----------+------------+--------+  GSV at knee             yes    >500 ms      0.3               +--------------+---------+------+-----------+------------+--------+  GSV prox calf           yes    >500 ms      0.3               +--------------+---------+------+-----------+------------+--------+  SSV Pop Fossa no                            0.4                +--------------+---------+------+-----------+------------+--------+  SSV prox calf no  0.38              +--------------+---------+------+-----------+------------+--------+  SSV mid calf            yes    >500 ms      0.36              +--------------+---------+------+-----------+------------+--------+    Medical Decision Making   JAMMAL SARR is a 73 y.o. male who presents for evaluation of venous insufficiency  Based on the patient's duplex, there is reflux in the patient's right common femoral vein, greater saphenous vein at the knee and proximal calf, and small saphenous vein in the mid calf.  The remainder of the patient's deep and superficial venous system is competent.  There is no evidence of DVT on duplex The patient states he has dealt with bilateral lower extremity swelling for several years, slowly worsening over time.  His swelling is more significant in the right leg.  He has not worn compression stockings and does not elevate his legs He also endorses significant purple pigmentation to his feet after prolonged sitting and recurrent venous ulcerations. His right leg will repetitively develop water blisters and ulcerations, which spontaneously heal after a few weeks On exam he has 1+ RLE edema. He has healed venous ulcerations to the right shin. He does have stasis pigmentation of both his feet I have explained to the patient that his swelling, ulcerations, and pigmentation is due to venous insufficiency. He would not be a candidate for ablation since he does not have GSV reflux in the thigh. His symptoms will be best controlled with conservative therapy, including compression and leg elevation. I believe he will experience less/no venous ulcerations if his leg swelling is controlled He was measured for and given a pair of 15-12mmhg knee high compression stockings. He can follow up with our office as needed   Ernestene Mention,  PA-C [facility] Office: [phone]  05/28/2023, 11:12 AM  Call MD: Randie Heinz

## 2023-05-31 DIAGNOSIS — G5603 Carpal tunnel syndrome, bilateral upper limbs: Secondary | ICD-10-CM | POA: Diagnosis not present

## 2023-06-01 DIAGNOSIS — G4733 Obstructive sleep apnea (adult) (pediatric): Secondary | ICD-10-CM | POA: Diagnosis not present

## 2023-06-04 DIAGNOSIS — I10 Essential (primary) hypertension: Secondary | ICD-10-CM | POA: Diagnosis not present

## 2023-06-04 DIAGNOSIS — R0902 Hypoxemia: Secondary | ICD-10-CM | POA: Diagnosis not present

## 2023-06-08 DIAGNOSIS — E119 Type 2 diabetes mellitus without complications: Secondary | ICD-10-CM | POA: Diagnosis not present

## 2023-06-11 DIAGNOSIS — E118 Type 2 diabetes mellitus with unspecified complications: Secondary | ICD-10-CM | POA: Diagnosis not present

## 2023-06-23 ENCOUNTER — Encounter: Payer: Self-pay | Admitting: Internal Medicine

## 2023-06-23 ENCOUNTER — Ambulatory Visit: Payer: Medicare Other | Admitting: Internal Medicine

## 2023-06-23 VITALS — BP 110/62 | HR 74 | Ht 69.0 in | Wt 274.2 lb

## 2023-06-23 DIAGNOSIS — G4733 Obstructive sleep apnea (adult) (pediatric): Secondary | ICD-10-CM

## 2023-06-23 DIAGNOSIS — J301 Allergic rhinitis due to pollen: Secondary | ICD-10-CM

## 2023-06-23 DIAGNOSIS — Z7722 Contact with and (suspected) exposure to environmental tobacco smoke (acute) (chronic): Secondary | ICD-10-CM | POA: Diagnosis not present

## 2023-06-23 DIAGNOSIS — J4520 Mild intermittent asthma, uncomplicated: Secondary | ICD-10-CM | POA: Diagnosis not present

## 2023-06-23 MED ORDER — FLUTICASONE PROPIONATE 50 MCG/ACT NA SUSP: 1.0000 | Freq: Every day | NASAL | 11 refills | Status: AC

## 2023-06-23 MED ORDER — FLUTICASONE FUROATE-VILANTEROL 100-25 MCG/ACT IN AEPB: 1.0000 | INHALATION_SPRAY | Freq: Every day | RESPIRATORY_TRACT | 5 refills | Status: AC

## 2023-06-23 NOTE — Patient Instructions (Addendum)
 It was a pleasure to see you today!  Please schedule follow up with myself in 1 year.  If my schedule is not open yet, we will contact you with a reminder closer to that time. Please call [phone] if you haven't heard from us  a month before, and always call us  sooner if issues or concerns arise. You can also send us  a message through MyChart, but but aware that this is not to be used for urgent issues and it may take up to 5-7 days to receive a reply. Please be aware that you will likely be able to view your results before I have a chance to respond to them. Please give us  5 business days to respond to any non-urgent results.    VISIT SUMMARY:  Today, we discussed your concerns about breathing issues and recurrent colds. We reviewed your history of sleep apnea, secondhand smoke exposure, and work environment. We also addressed your seasonal allergies and recent respiratory infections.   Please schedule your annual follow-up visit to monitor your respiratory health and the effectiveness of your treatments. Contact our office if your symptoms change or if you obtain more information about your brother's lung condition.  YOUR PLAN:  - MILD INTERMITTENT ASTHMA: You have been experiencing frequent colds that take longer to resolve, and you recently had a mild pneumonia episode. This could be due to mild asthma and secondhand smoke exposure. I recommend starting to use the Breo inhaler daily as prescribed if you have symptoms of asthma such as chest tightness, wheezing, coughing, shortness of breath. You should also start using it if you have exposure to a sick contact, worsening allergies, or any other trigger for your asthma. I recommend you keep using it even after your respiratory symptoms resolve for 3-4 days. The goal of this therapy is to prevent your symptoms from becoming a flare severe enough to require steroids like prednisone.   -SEASONAL ALLERGIC RHINITIS: Your nasal congestion is likely  due to seasonal allergies, especially with pollen exposure. You have been prescribed Flonase nasal spray to help with this. Use it as needed, especially during allergy season. Remember to tilt your chin down, use your right hand for your left nostril and left hand for your right nostril, aim towards your sinuses, and avoid deep sniffing after spraying.  -OBSTRUCTIVE SLEEP APNEA: Your sleep apnea is being managed well with your CPAP machine, and the addition of oxygen has improved your condition. Continue using your CPAP machine as directed.

## 2023-06-23 NOTE — Progress Notes (Signed)
 Glenn Herrera    [id number]    [birth year]  Primary Care Physician:Ehinger, Molly Maduro, MD (Inactive)  Referring Physician: Debroah Loop, DO [address] [place],  [place] [address] Reason for Consultation: "check up for respiratory condition" Date of Consultation: 06/23/2023  Chief complaint:   Chief Complaint  Patient presents with   Consult    Check up for respiratory condition. Pt is not having any issues today. He states his brother has respiratory issues, wants to take precaution.     HPI: Discussed the use of AI scribe software for clinical note transcription with the patient, who gave verbal consent to proceed.  History of Present Illness Glenn Herrera is a 73 year old male with sleep apnea who presents with concerns about breathing issues and recurrent colds.  He is concerned about his breathing due to his brother's history of lung issues. 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 He is unsure of his brother's specific lung condition but mentions his brother participated in a clinical study and took medication for it.  He experiences frequent colds, noting that he catches them easily and they take longer to resolve. He recalls a cold last year around Father's Day and another in December, each taking about three weeks to recover. In March, he was treated with antibiotics for a cold that developed into mild pneumonia on one side. Historically, colds did not linger as long.  He has nasal congestion that seems to be seasonal, worsening with pollen exposure. He describes it as 'stuffy nose and everything else' and notes that it is not typical for him to have such prolonged symptoms. He has tried over-the-counter antihistamines like Claritin or Allegra. No regular shortness of breath or daily cough, except when experiencing these colds. He has never been hospitalized for pneumonia or  bronchitis.  He has a history of sleep apnea and uses a CPAP machine regularly, which he finds beneficial for his sleep quality. His apnea improved significantly with the addition of oxygen during his sleep study.  He has a history of secondhand smoke exposure from his wife, who smoked for about 30 years of their marriage, and from his mother during childhood. He smoked cigars occasionally over 34 years ago but denies cigarette smoking.  He worked in environments with potential exposure to [employer] and possibly asbestos, though he did not work directly with welding or Geographical information systems officer.     Glenn Herrera  Social history:  Occupation: worked in Materials engineer, Music therapist work.  Exposures: lives at home with wife Smoking history: never smoker, occasional cigar. Former passive smoke exposure in wife for 30 years. Parents smoked in childhood.   Social History   Occupational History   Not on file  Tobacco Use   Smoking status: Never   Smokeless tobacco: Never  Substance and Sexual Activity   Alcohol use: No   Drug use: No   Sexual activity: Not on file    Relevant family history:  Family History  Problem Relation Age of Onset   Diabetes Mother    Hypertension Mother    Diabetes Mellitus II Mother    Gout Mother    Sleep apnea Mother    Hypertension Father    Sleep apnea Brother    Diabetes Mellitus II Brother     Past Medical History:  Diagnosis Date   Cancer (HCC)  prostate    Diabetes mellitus without complication (HCC)    GERD (gastroesophageal reflux disease)    Gout    High blood cholesterol    HLD (hyperlipidemia)    Hypertension    OSA on CPAP    Peripheral neuropathy    Prostate cancer [facility])     Past Surgical History:  Procedure Laterality Date   CATARACT EXTRACTION Bilateral 02/2009   3/82015   LAPAROSCOPIC APPENDECTOMY N/A 02/06/2016   Procedure: APPENDECTOMY LAPAROSCOPIC;  Surgeon: Ayesha Lente, MD;   Location: [facility];  Service: General;  Laterality: N/A;   LAPAROSCOPIC APPENDECTOMY  01/2016   PROSTATE SURGERY  2002     Physical Exam: Blood pressure 110/62, pulse 74, height 5\' 9"  (1.753 m), weight 274 lb 3.2 oz (124.4 kg), SpO2 96%. Gen:      No acute distress ENT:  macroglossia, no nasal polyps, mucus membranes moist Lungs:    No increased respiratory effort, symmetric chest wall excursion, clear to auscultation bilaterally, no wheezes or crackles CV:         Regular rate and rhythm; no murmurs, rubs, or gallops.  No pedal edema Abd:      Obese, soft, nontender MSK: no acute synovitis of DIP or PIP joints, no mechanics hands.  Skin:      Warm and dry; upper extremity hemorrhage Neuro: normal speech, no focal facial asymmetry Psych: alert and oriented x3, normal mood and affect   Data Reviewed/Medical Decision Making:  Independent interpretation of tests: Imaging:  Review of patient's chest xray 2018 images revealed LLL atelectasis. The patient's images have been independently reviewed by me.    PFTs:  Labs:  Lab Results  Component Value Date   NA 136 09/19/2016   K 4.7 09/19/2016   CO2 28 09/19/2016   GLUCOSE 157 (H) 09/19/2016   BUN 17 09/19/2016   CREATININE 1.00 09/20/2016   CALCIUM 9.2 09/19/2016   GFRNONAA >60 09/20/2016   Lab Results  Component Value Date   WBC 6.8 06/09/2018   HGB 13.2 06/09/2018   HCT 39.1 06/09/2018   MCV 81.3 06/09/2018   PLT 204 06/09/2018     Immunization status:  Immunization History  Administered Date(s) Administered   Influenza, High Dose Seasonal PF 12/10/2018   Influenza,inj,Quad PF,6+ Mos 11/01/2017   PFIZER(Purple Top)SARS-COV-2 Vaccination 05/05/2019, 05/30/2019   Zoster Recombinant(Shingrix) 11/01/2017     I reviewed prior external note(s) from vascular surgery, neurology, ed  I reviewed the result(s) of the labs and imaging as noted above.   I have ordered    Assessment and Plan Assessment & Plan Mild  intermittent asthma Prolonged recovery from infections, recent pneumonia episode. No chronic fibrotic lung disease. Possible mild asthma. Secondhand smoke exposure may hinder recovery. - Prescribed Breo steroid inhaler for use during infections. Instructed to use one puff daily when symptomatic, gargle after use, continue for 3-4 days post-recovery.  Seasonal Allergic Rhinitis Nasal congestion due to seasonal allergies, exacerbated by pollen. No prior nasal spray use. - Prescribed Flonase nasal spray. Instructed on proper usage: tilt chin down, use right hand for left nostril and left hand for right nostril, aim towards sinuses, avoid deep sniffing post-spray. - Advised use as needed, especially during allergy season.  Obstructive Sleep Apnea Sleep apnea managed with CPAP therapy. Good adherence and improved sleep quality. Oxygen supplementation improved apnea events.     Return to Care: Return in about 1 year (around 06/22/2024).  Louie Rover, MD Pulmonary and Critical Care Medicine [facility] Office:[phone]  CC: Elida Grounds, DO

## 2023-07-01 ENCOUNTER — Encounter: Payer: Self-pay | Admitting: Radiology

## 2023-07-05 DIAGNOSIS — G4733 Obstructive sleep apnea (adult) (pediatric): Secondary | ICD-10-CM | POA: Diagnosis not present

## 2023-07-05 DIAGNOSIS — R0902 Hypoxemia: Secondary | ICD-10-CM | POA: Diagnosis not present

## 2023-07-05 DIAGNOSIS — I10 Essential (primary) hypertension: Secondary | ICD-10-CM | POA: Diagnosis not present

## 2023-07-06 DIAGNOSIS — G5603 Carpal tunnel syndrome, bilateral upper limbs: Secondary | ICD-10-CM | POA: Diagnosis not present

## 2023-07-08 DIAGNOSIS — E119 Type 2 diabetes mellitus without complications: Secondary | ICD-10-CM | POA: Diagnosis not present

## 2023-07-16 DIAGNOSIS — E118 Type 2 diabetes mellitus with unspecified complications: Secondary | ICD-10-CM | POA: Diagnosis not present

## 2023-07-16 DIAGNOSIS — I10 Essential (primary) hypertension: Secondary | ICD-10-CM | POA: Diagnosis not present

## 2023-08-01 DIAGNOSIS — G4733 Obstructive sleep apnea (adult) (pediatric): Secondary | ICD-10-CM | POA: Diagnosis not present

## 2023-08-04 DIAGNOSIS — I10 Essential (primary) hypertension: Secondary | ICD-10-CM | POA: Diagnosis not present

## 2023-08-04 DIAGNOSIS — R0902 Hypoxemia: Secondary | ICD-10-CM | POA: Diagnosis not present

## 2023-08-04 DIAGNOSIS — G4733 Obstructive sleep apnea (adult) (pediatric): Secondary | ICD-10-CM | POA: Diagnosis not present

## 2023-08-08 DIAGNOSIS — E119 Type 2 diabetes mellitus without complications: Secondary | ICD-10-CM | POA: Diagnosis not present

## 2023-08-24 DIAGNOSIS — E1169 Type 2 diabetes mellitus with other specified complication: Secondary | ICD-10-CM | POA: Diagnosis not present

## 2023-08-24 DIAGNOSIS — I739 Peripheral vascular disease, unspecified: Secondary | ICD-10-CM | POA: Diagnosis not present

## 2023-08-24 DIAGNOSIS — E1121 Type 2 diabetes mellitus with diabetic nephropathy: Secondary | ICD-10-CM | POA: Diagnosis not present

## 2023-08-24 DIAGNOSIS — E118 Type 2 diabetes mellitus with unspecified complications: Secondary | ICD-10-CM | POA: Diagnosis not present

## 2023-09-04 DIAGNOSIS — G4733 Obstructive sleep apnea (adult) (pediatric): Secondary | ICD-10-CM | POA: Diagnosis not present

## 2023-09-04 DIAGNOSIS — I10 Essential (primary) hypertension: Secondary | ICD-10-CM | POA: Diagnosis not present

## 2023-09-04 DIAGNOSIS — R0902 Hypoxemia: Secondary | ICD-10-CM | POA: Diagnosis not present

## 2023-09-06 DIAGNOSIS — E118 Type 2 diabetes mellitus with unspecified complications: Secondary | ICD-10-CM | POA: Diagnosis not present

## 2023-09-06 DIAGNOSIS — M1990 Unspecified osteoarthritis, unspecified site: Secondary | ICD-10-CM | POA: Diagnosis not present

## 2023-09-06 DIAGNOSIS — E1169 Type 2 diabetes mellitus with other specified complication: Secondary | ICD-10-CM | POA: Diagnosis not present

## 2023-09-06 DIAGNOSIS — E1121 Type 2 diabetes mellitus with diabetic nephropathy: Secondary | ICD-10-CM | POA: Diagnosis not present

## 2023-09-07 DIAGNOSIS — E119 Type 2 diabetes mellitus without complications: Secondary | ICD-10-CM | POA: Diagnosis not present

## 2023-09-22 DIAGNOSIS — E1169 Type 2 diabetes mellitus with other specified complication: Secondary | ICD-10-CM | POA: Diagnosis not present

## 2023-09-22 DIAGNOSIS — E1121 Type 2 diabetes mellitus with diabetic nephropathy: Secondary | ICD-10-CM | POA: Diagnosis not present

## 2023-09-22 DIAGNOSIS — I739 Peripheral vascular disease, unspecified: Secondary | ICD-10-CM | POA: Diagnosis not present

## 2023-09-22 DIAGNOSIS — E118 Type 2 diabetes mellitus with unspecified complications: Secondary | ICD-10-CM | POA: Diagnosis not present

## 2023-10-04 DIAGNOSIS — G4733 Obstructive sleep apnea (adult) (pediatric): Secondary | ICD-10-CM | POA: Diagnosis not present

## 2023-10-04 DIAGNOSIS — R0902 Hypoxemia: Secondary | ICD-10-CM | POA: Diagnosis not present

## 2023-10-04 DIAGNOSIS — I10 Essential (primary) hypertension: Secondary | ICD-10-CM | POA: Diagnosis not present

## 2023-10-07 DIAGNOSIS — M1990 Unspecified osteoarthritis, unspecified site: Secondary | ICD-10-CM | POA: Diagnosis not present

## 2023-10-07 DIAGNOSIS — E1169 Type 2 diabetes mellitus with other specified complication: Secondary | ICD-10-CM | POA: Diagnosis not present

## 2023-10-07 DIAGNOSIS — E118 Type 2 diabetes mellitus with unspecified complications: Secondary | ICD-10-CM | POA: Diagnosis not present

## 2023-10-07 DIAGNOSIS — E1121 Type 2 diabetes mellitus with diabetic nephropathy: Secondary | ICD-10-CM | POA: Diagnosis not present

## 2023-10-07 DIAGNOSIS — I739 Peripheral vascular disease, unspecified: Secondary | ICD-10-CM | POA: Diagnosis not present

## 2023-10-08 DIAGNOSIS — E119 Type 2 diabetes mellitus without complications: Secondary | ICD-10-CM | POA: Diagnosis not present

## 2023-10-14 DIAGNOSIS — G4733 Obstructive sleep apnea (adult) (pediatric): Secondary | ICD-10-CM | POA: Diagnosis not present

## 2023-10-22 DIAGNOSIS — E1169 Type 2 diabetes mellitus with other specified complication: Secondary | ICD-10-CM | POA: Diagnosis not present

## 2023-10-22 DIAGNOSIS — E1121 Type 2 diabetes mellitus with diabetic nephropathy: Secondary | ICD-10-CM | POA: Diagnosis not present

## 2023-10-22 DIAGNOSIS — E118 Type 2 diabetes mellitus with unspecified complications: Secondary | ICD-10-CM | POA: Diagnosis not present

## 2023-10-22 DIAGNOSIS — I739 Peripheral vascular disease, unspecified: Secondary | ICD-10-CM | POA: Diagnosis not present

## 2023-11-02 DIAGNOSIS — M109 Gout, unspecified: Secondary | ICD-10-CM | POA: Diagnosis not present

## 2023-11-02 DIAGNOSIS — I10 Essential (primary) hypertension: Secondary | ICD-10-CM | POA: Diagnosis not present

## 2023-11-02 DIAGNOSIS — Z0000 Encounter for general adult medical examination without abnormal findings: Secondary | ICD-10-CM | POA: Diagnosis not present

## 2023-11-02 DIAGNOSIS — E118 Type 2 diabetes mellitus with unspecified complications: Secondary | ICD-10-CM | POA: Diagnosis not present

## 2023-11-02 DIAGNOSIS — Z1331 Encounter for screening for depression: Secondary | ICD-10-CM | POA: Diagnosis not present

## 2023-11-02 DIAGNOSIS — E782 Mixed hyperlipidemia: Secondary | ICD-10-CM | POA: Diagnosis not present

## 2023-11-04 DIAGNOSIS — I10 Essential (primary) hypertension: Secondary | ICD-10-CM | POA: Diagnosis not present

## 2023-11-04 DIAGNOSIS — R0902 Hypoxemia: Secondary | ICD-10-CM | POA: Diagnosis not present

## 2023-11-07 DIAGNOSIS — E118 Type 2 diabetes mellitus with unspecified complications: Secondary | ICD-10-CM | POA: Diagnosis not present

## 2023-11-07 DIAGNOSIS — I739 Peripheral vascular disease, unspecified: Secondary | ICD-10-CM | POA: Diagnosis not present

## 2023-11-07 DIAGNOSIS — E1121 Type 2 diabetes mellitus with diabetic nephropathy: Secondary | ICD-10-CM | POA: Diagnosis not present

## 2023-11-07 DIAGNOSIS — E1169 Type 2 diabetes mellitus with other specified complication: Secondary | ICD-10-CM | POA: Diagnosis not present

## 2023-11-07 DIAGNOSIS — M1990 Unspecified osteoarthritis, unspecified site: Secondary | ICD-10-CM | POA: Diagnosis not present

## 2023-11-08 DIAGNOSIS — E119 Type 2 diabetes mellitus without complications: Secondary | ICD-10-CM | POA: Diagnosis not present

## 2023-11-14 DIAGNOSIS — G4733 Obstructive sleep apnea (adult) (pediatric): Secondary | ICD-10-CM | POA: Diagnosis not present

## 2023-11-18 DIAGNOSIS — M170 Bilateral primary osteoarthritis of knee: Secondary | ICD-10-CM | POA: Diagnosis not present

## 2023-11-21 DIAGNOSIS — E1169 Type 2 diabetes mellitus with other specified complication: Secondary | ICD-10-CM | POA: Diagnosis not present

## 2023-11-21 DIAGNOSIS — E118 Type 2 diabetes mellitus with unspecified complications: Secondary | ICD-10-CM | POA: Diagnosis not present

## 2023-11-21 DIAGNOSIS — E1121 Type 2 diabetes mellitus with diabetic nephropathy: Secondary | ICD-10-CM | POA: Diagnosis not present

## 2023-11-21 DIAGNOSIS — I739 Peripheral vascular disease, unspecified: Secondary | ICD-10-CM | POA: Diagnosis not present

## 2023-11-25 DIAGNOSIS — M170 Bilateral primary osteoarthritis of knee: Secondary | ICD-10-CM | POA: Diagnosis not present

## 2023-12-02 DIAGNOSIS — M1712 Unilateral primary osteoarthritis, left knee: Secondary | ICD-10-CM | POA: Diagnosis not present

## 2023-12-02 DIAGNOSIS — M170 Bilateral primary osteoarthritis of knee: Secondary | ICD-10-CM | POA: Diagnosis not present

## 2023-12-05 DIAGNOSIS — I10 Essential (primary) hypertension: Secondary | ICD-10-CM | POA: Diagnosis not present

## 2023-12-05 DIAGNOSIS — R0902 Hypoxemia: Secondary | ICD-10-CM | POA: Diagnosis not present

## 2023-12-07 DIAGNOSIS — E1169 Type 2 diabetes mellitus with other specified complication: Secondary | ICD-10-CM | POA: Diagnosis not present

## 2023-12-07 DIAGNOSIS — E118 Type 2 diabetes mellitus with unspecified complications: Secondary | ICD-10-CM | POA: Diagnosis not present

## 2023-12-07 DIAGNOSIS — M1990 Unspecified osteoarthritis, unspecified site: Secondary | ICD-10-CM | POA: Diagnosis not present

## 2023-12-07 DIAGNOSIS — E1121 Type 2 diabetes mellitus with diabetic nephropathy: Secondary | ICD-10-CM | POA: Diagnosis not present

## 2023-12-07 DIAGNOSIS — I739 Peripheral vascular disease, unspecified: Secondary | ICD-10-CM | POA: Diagnosis not present

## 2023-12-08 DIAGNOSIS — E119 Type 2 diabetes mellitus without complications: Secondary | ICD-10-CM | POA: Diagnosis not present

## 2023-12-14 DIAGNOSIS — G4733 Obstructive sleep apnea (adult) (pediatric): Secondary | ICD-10-CM | POA: Diagnosis not present

## 2023-12-21 DIAGNOSIS — E118 Type 2 diabetes mellitus with unspecified complications: Secondary | ICD-10-CM | POA: Diagnosis not present

## 2023-12-21 DIAGNOSIS — E1121 Type 2 diabetes mellitus with diabetic nephropathy: Secondary | ICD-10-CM | POA: Diagnosis not present

## 2023-12-21 DIAGNOSIS — I739 Peripheral vascular disease, unspecified: Secondary | ICD-10-CM | POA: Diagnosis not present

## 2023-12-21 DIAGNOSIS — E1169 Type 2 diabetes mellitus with other specified complication: Secondary | ICD-10-CM | POA: Diagnosis not present

## 2024-01-07 DIAGNOSIS — E118 Type 2 diabetes mellitus with unspecified complications: Secondary | ICD-10-CM | POA: Diagnosis not present

## 2024-01-07 DIAGNOSIS — M1990 Unspecified osteoarthritis, unspecified site: Secondary | ICD-10-CM | POA: Diagnosis not present

## 2024-01-07 DIAGNOSIS — I739 Peripheral vascular disease, unspecified: Secondary | ICD-10-CM | POA: Diagnosis not present

## 2024-01-07 DIAGNOSIS — E1121 Type 2 diabetes mellitus with diabetic nephropathy: Secondary | ICD-10-CM | POA: Diagnosis not present

## 2024-01-07 DIAGNOSIS — E1169 Type 2 diabetes mellitus with other specified complication: Secondary | ICD-10-CM | POA: Diagnosis not present

## 2024-01-08 DIAGNOSIS — E119 Type 2 diabetes mellitus without complications: Secondary | ICD-10-CM | POA: Diagnosis not present

## 2024-01-20 DIAGNOSIS — E118 Type 2 diabetes mellitus with unspecified complications: Secondary | ICD-10-CM | POA: Diagnosis not present

## 2024-01-20 DIAGNOSIS — I739 Peripheral vascular disease, unspecified: Secondary | ICD-10-CM | POA: Diagnosis not present

## 2024-01-20 DIAGNOSIS — E1121 Type 2 diabetes mellitus with diabetic nephropathy: Secondary | ICD-10-CM | POA: Diagnosis not present

## 2024-01-20 DIAGNOSIS — E1169 Type 2 diabetes mellitus with other specified complication: Secondary | ICD-10-CM | POA: Diagnosis not present

## 2024-02-06 DIAGNOSIS — M1990 Unspecified osteoarthritis, unspecified site: Secondary | ICD-10-CM | POA: Diagnosis not present

## 2024-02-06 DIAGNOSIS — E118 Type 2 diabetes mellitus with unspecified complications: Secondary | ICD-10-CM | POA: Diagnosis not present

## 2024-02-06 DIAGNOSIS — E1169 Type 2 diabetes mellitus with other specified complication: Secondary | ICD-10-CM | POA: Diagnosis not present

## 2024-02-06 DIAGNOSIS — I739 Peripheral vascular disease, unspecified: Secondary | ICD-10-CM | POA: Diagnosis not present

## 2024-02-06 DIAGNOSIS — E1121 Type 2 diabetes mellitus with diabetic nephropathy: Secondary | ICD-10-CM | POA: Diagnosis not present
